# Patient Record
Sex: Male | Born: 1945 | Race: White | Hispanic: No | Marital: Married | Smoking: Former smoker
Health system: Southern US, Community
[De-identification: ages and names within clinical notes are randomized; demographics above are authoritative.]

## PROBLEM LIST (undated history)

## (undated) DIAGNOSIS — I10 Essential (primary) hypertension: Secondary | ICD-10-CM

## (undated) DIAGNOSIS — I219 Acute myocardial infarction, unspecified: Secondary | ICD-10-CM

## (undated) DIAGNOSIS — G8929 Other chronic pain: Secondary | ICD-10-CM

## (undated) DIAGNOSIS — M549 Dorsalgia, unspecified: Secondary | ICD-10-CM

## (undated) DIAGNOSIS — E785 Hyperlipidemia, unspecified: Secondary | ICD-10-CM

## (undated) DIAGNOSIS — D126 Benign neoplasm of colon, unspecified: Secondary | ICD-10-CM

## (undated) DIAGNOSIS — I2510 Atherosclerotic heart disease of native coronary artery without angina pectoris: Secondary | ICD-10-CM

## (undated) DIAGNOSIS — K635 Polyp of colon: Secondary | ICD-10-CM

## (undated) HISTORY — PX: LUMBAR SPINE SURGERY: SHX701

## (undated) HISTORY — DX: Dorsalgia, unspecified: M54.9

## (undated) HISTORY — DX: Essential (primary) hypertension: I10

## (undated) HISTORY — PX: CORONARY ARTERY BYPASS GRAFT: SHX141

## (undated) HISTORY — PX: SHOULDER SURGERY: SHX246

## (undated) HISTORY — PX: BICEPS TENDON REPAIR: SHX566

## (undated) HISTORY — DX: Other chronic pain: G89.29

## (undated) HISTORY — DX: Hyperlipidemia, unspecified: E78.5

---

## 1997-11-21 ENCOUNTER — Ambulatory Visit (HOSPITAL_COMMUNITY): Admission: RE | Admit: 1997-11-21 | Discharge: 1997-11-21 | Payer: Self-pay | Admitting: Neurosurgery

## 1997-12-12 ENCOUNTER — Inpatient Hospital Stay (HOSPITAL_COMMUNITY): Admission: RE | Admit: 1997-12-12 | Discharge: 1997-12-14 | Payer: Self-pay | Admitting: Neurosurgery

## 1998-01-16 ENCOUNTER — Ambulatory Visit (HOSPITAL_COMMUNITY): Admission: RE | Admit: 1998-01-16 | Discharge: 1998-01-16 | Payer: Self-pay | Admitting: Neurosurgery

## 1998-01-16 ENCOUNTER — Encounter: Payer: Self-pay | Admitting: Neurosurgery

## 1998-03-26 ENCOUNTER — Ambulatory Visit (HOSPITAL_COMMUNITY): Admission: RE | Admit: 1998-03-26 | Discharge: 1998-03-26 | Payer: Self-pay | Admitting: Neurosurgery

## 1998-03-26 ENCOUNTER — Encounter: Payer: Self-pay | Admitting: Neurosurgery

## 1998-10-22 ENCOUNTER — Ambulatory Visit (HOSPITAL_COMMUNITY): Admission: RE | Admit: 1998-10-22 | Discharge: 1998-10-22 | Payer: Self-pay | Admitting: Neurosurgery

## 1998-10-22 ENCOUNTER — Encounter: Payer: Self-pay | Admitting: Neurosurgery

## 1998-11-03 ENCOUNTER — Ambulatory Visit (HOSPITAL_COMMUNITY): Admission: RE | Admit: 1998-11-03 | Discharge: 1998-11-03 | Payer: Self-pay | Admitting: Neurosurgery

## 1998-11-03 ENCOUNTER — Encounter: Payer: Self-pay | Admitting: Neurosurgery

## 2002-02-19 ENCOUNTER — Encounter: Payer: Self-pay | Admitting: Orthopedic Surgery

## 2002-02-19 ENCOUNTER — Encounter: Admission: RE | Admit: 2002-02-19 | Discharge: 2002-02-19 | Payer: Self-pay | Admitting: Orthopedic Surgery

## 2002-02-21 ENCOUNTER — Ambulatory Visit (HOSPITAL_BASED_OUTPATIENT_CLINIC_OR_DEPARTMENT_OTHER): Admission: RE | Admit: 2002-02-21 | Discharge: 2002-02-22 | Payer: Self-pay | Admitting: Orthopedic Surgery

## 2007-10-31 ENCOUNTER — Ambulatory Visit: Payer: Self-pay | Admitting: Gastroenterology

## 2007-11-14 ENCOUNTER — Emergency Department: Payer: Self-pay | Admitting: Emergency Medicine

## 2007-11-14 ENCOUNTER — Other Ambulatory Visit: Payer: Self-pay

## 2008-06-05 ENCOUNTER — Emergency Department: Payer: Self-pay | Admitting: Emergency Medicine

## 2010-11-21 ENCOUNTER — Emergency Department: Payer: Self-pay | Admitting: Emergency Medicine

## 2011-02-23 ENCOUNTER — Telehealth: Payer: Self-pay | Admitting: *Deleted

## 2011-02-23 MED ORDER — DIAZEPAM 5 MG PO TABS: 5.0000 mg | ORAL_TABLET | Freq: Two times a day (BID) | ORAL | Status: DC | PRN

## 2011-02-23 NOTE — Telephone Encounter (Signed)
Called in.

## 2011-02-23 NOTE — Telephone Encounter (Signed)
Pharm faxed RF request -  Diazepam 5 mg 1 bid # 60. OK?

## 2011-02-23 NOTE — Telephone Encounter (Signed)
Ok #[phone] 

## 2011-06-23 ENCOUNTER — Ambulatory Visit (INDEPENDENT_AMBULATORY_CARE_PROVIDER_SITE_OTHER): Payer: Medicare Other | Admitting: Internal Medicine

## 2011-06-23 ENCOUNTER — Encounter: Payer: Self-pay | Admitting: Internal Medicine

## 2011-06-23 ENCOUNTER — Other Ambulatory Visit: Payer: Self-pay | Admitting: Internal Medicine

## 2011-06-23 VITALS — BP 128/62 | HR 70 | Temp 97.7°F | Ht 71.0 in | Wt 194.0 lb

## 2011-06-23 DIAGNOSIS — R1084 Generalized abdominal pain: Secondary | ICD-10-CM

## 2011-06-23 DIAGNOSIS — E785 Hyperlipidemia, unspecified: Secondary | ICD-10-CM | POA: Insufficient documentation

## 2011-06-23 DIAGNOSIS — M539 Dorsopathy, unspecified: Secondary | ICD-10-CM

## 2011-06-23 DIAGNOSIS — G4700 Insomnia, unspecified: Secondary | ICD-10-CM

## 2011-06-23 DIAGNOSIS — M62830 Muscle spasm of back: Secondary | ICD-10-CM

## 2011-06-23 LAB — COMPREHENSIVE METABOLIC PANEL
AST: 23 U/L (ref 0–37)
BUN: 16 mg/dL (ref 6–23)
CO2: 29 mEq/L (ref 19–32)
Calcium: 9.3 mg/dL (ref 8.4–10.5)
Creatinine, Ser: 0.9 mg/dL (ref 0.4–1.5)
Glucose, Bld: 109 mg/dL — ABNORMAL HIGH (ref 70–99)
Potassium: 4.2 mEq/L (ref 3.5–5.1)
Sodium: 139 mEq/L (ref 135–145)
Total Bilirubin: 0.2 mg/dL — ABNORMAL LOW (ref 0.3–1.2)

## 2011-06-23 LAB — CBC WITH DIFFERENTIAL/PLATELET
Basophils Relative: 1 % (ref 0.0–3.0)
Eosinophils Absolute: 0.3 10*3/uL (ref 0.0–0.7)
HCT: 45 % (ref 39.0–52.0)
Hemoglobin: 14.9 g/dL (ref 13.0–17.0)
Lymphocytes Relative: 28.6 % (ref 12.0–46.0)
Lymphs Abs: 2.5 10*3/uL (ref 0.7–4.0)
MCHC: 33.2 g/dL (ref 30.0–36.0)
Monocytes Relative: 8.7 % (ref 3.0–12.0)
Neutro Abs: 5 10*3/uL (ref 1.4–7.7)
RBC: 4.92 Mil/uL (ref 4.22–5.81)

## 2011-06-23 LAB — LIPID PANEL
HDL: 38.1 mg/dL — ABNORMAL LOW (ref >39.00)
Total CHOL/HDL Ratio: 3
VLDL: 17.2 mg/dL (ref 0.0–40.0)

## 2011-06-23 LAB — TSH: TSH: 0.58 u[IU]/mL (ref 0.35–5.50)

## 2011-06-23 MED ORDER — AMITRIPTYLINE HCL 25 MG PO TABS: 25.0000 mg | ORAL_TABLET | Freq: Every day | ORAL | Status: DC

## 2011-06-23 MED ORDER — DIAZEPAM 5 MG PO TABS: 5.0000 mg | ORAL_TABLET | Freq: Two times a day (BID) | ORAL | Status: AC | PRN

## 2011-06-23 NOTE — Assessment & Plan Note (Signed)
Will try adding amitriptyline at bedtime to see if any improvement. Patient will e-mail with followup. He will also followup in clinic in one month.

## 2011-06-23 NOTE — Progress Notes (Signed)
Subjective:    Patient ID: Harry Pacheco, male    DOB: 03-Apr-1946, 66 y.o.   MRN: [id number]  HPI 66 year old male with history of chronic back pain, chronic insomnia, and hyperlipidemia presents for followup. He has 2 concerns today. First, he reports increased abdominal distention and diffuse abdominal pain over the last several weeks. He has also noticed increased bloating and belching. He notes frequent episodes of watery diarrhea. He has not noticed any blood in his stool. He has not had any nausea or vomiting. He notes that he feels similar to how he felt prior to his diagnosis of H. pylori last year. He notes that he completed treatment for this but now symptoms have recurred.  Second concern today is chronic insomnia. He notes that he has tried several medications in the past with little improvement. He has tried Ambien with no success. He also tried trazodone, which improved his ability to fall asleep but left him groggy in the morning. Currently, he has difficulty both falling asleep and staying asleep. He often goes up to 4 nights without sleeping at all. He is an extremely fatigued during the day. He has limited his caffeine intake and has 2 cups of coffee in the morning and one glass of sweet tea with dinner. He has tried to eliminate distractions in the bedroom. When he does wake, he leaves the bedroom and reads. He notes that he occasionally does not fall asleep until 5 or 6 in the morning. He denies any pain which is limiting his ability to sleep. He does occasionally use Valium for back pain and on days when he takes Valium he does not have trouble sleeping. He does not want to take this medication on a regular basis because he is concerned about potential addiction. His wife notes that he sometimes has increased anxiety and she feels that this might be contributing to his insomnia.  Outpatient Encounter Prescriptions as of 06/23/2011  Medication Sig Dispense Refill  . aspirin 325 MG  tablet Take 325 mg by mouth daily.      Marland Kitchen atenolol (TENORMIN) 100 MG tablet Take 100 mg by mouth daily.      . diazepam (VALIUM) 5 MG tablet Take 1 tablet (5 mg total) by mouth 2 (two) times daily as needed.  90 tablet  3  . losartan (COZAAR) 50 MG tablet Take 50 mg by mouth daily.      . simvastatin (ZOCOR) 20 MG tablet Take 20 mg by mouth every evening.      Marland Kitchen DISCONTD: diazepam (VALIUM) 5 MG tablet Take 1 tablet (5 mg total) by mouth 2 (two) times daily as needed.  60 tablet  0  . amitriptyline (ELAVIL) 25 MG tablet Take 1 tablet (25 mg total) by mouth at bedtime.  30 tablet  3    Review of Systems  Constitutional: Negative for fever, chills, activity change, appetite change, fatigue and unexpected weight change.  Eyes: Negative for visual disturbance.  Respiratory: Negative for cough and shortness of breath.   Cardiovascular: Negative for chest pain, palpitations and leg swelling.  Gastrointestinal: Positive for abdominal pain, diarrhea and abdominal distention. Negative for nausea and vomiting.  Genitourinary: Negative for dysuria, urgency and difficulty urinating.  Musculoskeletal: Positive for back pain. Negative for arthralgias and gait problem.  Skin: Negative for color change and rash.  Hematological: Negative for adenopathy.  Psychiatric/Behavioral: Positive for sleep disturbance. Negative for dysphoric mood. The patient is not nervous/anxious.    BP 128/62  Pulse 70  Temp(Src) 97.7 F (36.5 C) (Oral)  Ht 5\' 11"  (1.803 m)  Wt 194 lb (87.998 kg)  BMI 27.06 kg/m2  SpO2 97%     Objective:   Physical Exam  Constitutional: He is oriented to person, place, and time. He appears well-developed and well-nourished. No distress.  HENT:  Head: Normocephalic and atraumatic.  Right Ear: External ear normal.  Left Ear: External ear normal.  Nose: Nose normal.  Mouth/Throat: Oropharynx is clear and moist. No oropharyngeal exudate.  Eyes: Conjunctivae and EOM are normal. Pupils are  equal, round, and reactive to light. Right eye exhibits no discharge. Left eye exhibits no discharge. No scleral icterus.  Neck: Normal range of motion. Neck supple. No tracheal deviation present. No thyromegaly present.  Cardiovascular: Normal rate, regular rhythm and normal heart sounds.  Exam reveals no gallop and no friction rub.   No murmur heard. Pulmonary/Chest: Effort normal and breath sounds normal. No respiratory distress. He has no wheezes. He has no rales. He exhibits no tenderness.  Abdominal: Soft. Bowel sounds are normal. He exhibits no distension and no mass. There is no tenderness. There is no rebound and no guarding.  Musculoskeletal: Normal range of motion. He exhibits no edema.  Lymphadenopathy:    He has no cervical adenopathy.  Neurological: He is alert and oriented to person, place, and time. No cranial nerve deficit. Coordination normal.  Skin: Skin is warm and dry. No rash noted. He is not diaphoretic. No erythema. No pallor.  Psychiatric: He has a normal mood and affect. His behavior is normal. Judgment and thought content normal.          Assessment & Plan:

## 2011-06-23 NOTE — Assessment & Plan Note (Signed)
Will check lipids and LFTs with labs today. 

## 2011-06-23 NOTE — Assessment & Plan Note (Signed)
Symptoms currently well-controlled using Valium 2-3 times per month for severe pain. Refill given today. Followup in one month.

## 2011-06-23 NOTE — Assessment & Plan Note (Signed)
Suspect this is recurrence of H. pylori. Will send stool testing today. We'll also check CBC, CMP. If testing for H. pylori is negative, will set up with GI for colonoscopy. Follow up 1 month.

## 2011-06-24 ENCOUNTER — Encounter: Payer: Self-pay | Admitting: Internal Medicine

## 2011-06-24 LAB — HELICOBACTER PYLORI  SPECIAL ANTIGEN: H. PYLORI Antigen: NEGATIVE

## 2011-06-27 LAB — STOOL CULTURE

## 2011-07-05 ENCOUNTER — Encounter: Payer: Self-pay | Admitting: Internal Medicine

## 2011-07-05 DIAGNOSIS — G4700 Insomnia, unspecified: Secondary | ICD-10-CM

## 2011-07-05 MED ORDER — AMITRIPTYLINE HCL 50 MG PO TABS: 50.0000 mg | ORAL_TABLET | Freq: Every day | ORAL | Status: DC

## 2011-07-26 ENCOUNTER — Ambulatory Visit: Payer: Medicare Other | Admitting: Internal Medicine

## 2011-09-16 ENCOUNTER — Ambulatory Visit (INDEPENDENT_AMBULATORY_CARE_PROVIDER_SITE_OTHER): Payer: Medicare Other | Admitting: Internal Medicine

## 2011-09-16 ENCOUNTER — Encounter: Payer: Self-pay | Admitting: Internal Medicine

## 2011-09-16 VITALS — BP 102/60 | HR 51 | Temp 98.7°F | Ht 71.0 in | Wt 197.5 lb

## 2011-09-16 DIAGNOSIS — G4700 Insomnia, unspecified: Secondary | ICD-10-CM

## 2011-09-16 DIAGNOSIS — M109 Gout, unspecified: Secondary | ICD-10-CM

## 2011-09-16 LAB — COMPREHENSIVE METABOLIC PANEL
ALT: 21 U/L (ref 0–53)
AST: 24 U/L (ref 0–37)
Alkaline Phosphatase: 61 U/L (ref 39–117)
Creatinine, Ser: 1.1 mg/dL (ref 0.4–1.5)
GFR: 72.42 mL/min (ref >60.00)
Total Bilirubin: 0.4 mg/dL (ref 0.3–1.2)

## 2011-09-16 NOTE — Assessment & Plan Note (Signed)
Symptoms are most consistent with gouty flare. We discussed potential triggers for gout including dietary triggers and medications. We also discussed treatments for acute gout flare and for maintenance. Will check uric acid level with labs today. Given that symptoms are improving, we'll hold off on any further intervention at this time. Patient will call if symptoms are recurrent. Noted he has had severe anxiety with prednisone in the past so would prefer not to use this in the future.

## 2011-09-16 NOTE — Patient Instructions (Signed)
Gout Gout is an inflammatory condition (arthritis) caused by a buildup of uric acid crystals in the joints. Uric acid is a chemical that is normally present in the blood. Under some circumstances, uric acid can form into crystals in your joints. This causes joint redness, soreness, and swelling (inflammation). Repeat attacks are common. Over time, uric acid crystals can form into masses (tophi) near a joint, causing disfigurement. Gout is treatable and often preventable. CAUSES  The disease begins with elevated levels of uric acid in the blood. Uric acid is produced by your body when it breaks down a naturally found substance called purines. This also happens when you eat certain foods such as meats and fish. Causes of an elevated uric acid level include:  Being passed down from parent to child (heredity).   Diseases that cause increased uric acid production (obesity, psoriasis, some cancers).   Excessive alcohol use.   Diet, especially diets rich in meat and seafood.   Medicines, including certain cancer-fighting drugs (chemotherapy), diuretics, and aspirin.   Chronic kidney disease. The kidneys are no longer able to remove uric acid well.   Problems with metabolism.  Conditions strongly associated with gout include:  Obesity.   High blood pressure.   High cholesterol.   Diabetes.  Not everyone with elevated uric acid levels gets gout. It is not understood why some people get gout and others do not. Surgery, joint injury, and eating too much of certain foods are some of the factors that can lead to gout. SYMPTOMS   An attack of gout comes on quickly. It causes intense pain with redness, swelling, and warmth in a joint.   Fever can occur.   Often, only one joint is involved. Certain joints are more commonly involved:   Base of the big toe.   Knee.   Ankle.   Wrist.   Finger.  Without treatment, an attack usually goes away in a few days to weeks. Between attacks, you  usually will not have symptoms, which is different from many other forms of arthritis. DIAGNOSIS  Your caregiver will suspect gout based on your symptoms and exam. Removal of fluid from the joint (arthrocentesis) is done to check for uric acid crystals. Your caregiver will give you a medicine that numbs the area (local anesthetic) and use a needle to remove joint fluid for exam. Gout is confirmed when uric acid crystals are seen in joint fluid, using a special microscope. Sometimes, blood, urine, and X-ray tests are also used. TREATMENT  There are 2 phases to gout treatment: treating the sudden onset (acute) attack and preventing attacks (prophylaxis). Treatment of an Acute Attack  Medicines are used. These include anti-inflammatory medicines or steroid medicines.   An injection of steroid medicine into the affected joint is sometimes necessary.   The painful joint is rested. Movement can worsen the arthritis.   You may use warm or cold treatments on painful joints, depending which works best for you.   Discuss the use of coffee, vitamin C, or cherries with your caregiver. These may be helpful treatment options.  Treatment to Prevent Attacks After the acute attack subsides, your caregiver may advise prophylactic medicine. These medicines either help your kidneys eliminate uric acid from your body or decrease your uric acid production. You may need to stay on these medicines for a very long time. The early phase of treatment with prophylactic medicine can be associated with an increase in acute gout attacks. For this reason, during the first few months   of treatment, your caregiver may also advise you to take medicines usually used for acute gout treatment. Be sure you understand your caregiver's directions. You should also discuss dietary treatment with your caregiver. Certain foods such as meats and fish can increase uric acid levels. Other foods such as dairy can decrease levels. Your caregiver  can give you a list of foods to avoid. HOME CARE INSTRUCTIONS   Do not take aspirin to relieve pain. This raises uric acid levels.   Only take over-the-counter or prescription medicines for pain, discomfort, or fever as directed by your caregiver.   Rest the joint as much as possible. When in bed, keep sheets and blankets off painful areas.   Keep the affected joint raised (elevated).   Use crutches if the painful joint is in your leg.   Drink enough water and fluids to keep your urine clear or pale yellow. This helps your body get rid of uric acid. Do not drink alcoholic beverages. They slow the passage of uric acid.   Follow your caregiver's dietary instructions. Pay careful attention to the amount of protein you eat. Your daily diet should emphasize fruits, vegetables, whole grains, and fat-free or low-fat milk products.   Maintain a healthy body weight.  SEEK MEDICAL CARE IF:   You have an oral temperature above 102 F (38.9 C).   You develop diarrhea, vomiting, or any side effects from medicines.   You do not feel better in 24 hours, or you are getting worse.  SEEK IMMEDIATE MEDICAL CARE IF:   Your joint becomes suddenly more tender and you have:   Chills.   An oral temperature above 102 F (38.9 C), not controlled by medicine.  MAKE SURE YOU:   Understand these instructions.   Will watch your condition.   Will get help right away if you are not doing well or get worse.  Document Released: 04/02/2000 Document Revised: 03/25/2011 Document Reviewed: 07/14/2009 [employer] Patient Information 2012 [employer]. 

## 2011-09-16 NOTE — Progress Notes (Signed)
Subjective:    Patient ID: Harry Pacheco, male    DOB: February 18, 1946, 66 y.o.   MRN: [id number]  HPI 66 year old male with history of chronic back pain, insomnia, and hypertension presents for acute visit complaining of right foot pain. He reports that approximately one week ago he developed severe right foot pain over his first MTP joint. He reports that the overlying skin was red and swollen. The area was painful to touch. He denies any trauma to the area. He read some information about gout and tried to adopt a diet that would help treat this. Over the subsequent week, the pain and redness have improved. He did not take nonsteroidals. He does not have any known history of gout. He does consume wine on a nightly basis. He occasionally has red meat.  In regards to his history of insomnia, he reports significant improvement with use of amitriptyline. He typically takes his medication about one hour before bedtime and is able to have a folate sleep. He denies any noted side effects from the medicine.  Outpatient Encounter Prescriptions as of 09/16/2011  Medication Sig Dispense Refill  . amitriptyline (ELAVIL) 50 MG tablet Take 1 tablet (50 mg total) by mouth at bedtime.  30 tablet  3  . aspirin 325 MG tablet Take 325 mg by mouth daily.      Marland Kitchen atenolol (TENORMIN) 100 MG tablet Take 100 mg by mouth daily.      . diazepam (VALIUM) 5 MG tablet Take 1 tablet (5 mg total) by mouth 2 (two) times daily as needed.  90 tablet  3  . losartan (COZAAR) 50 MG tablet Take 50 mg by mouth daily.      . simvastatin (ZOCOR) 20 MG tablet Take 20 mg by mouth every evening.        Review of Systems  Constitutional: Negative for fever, chills, activity change, appetite change, fatigue and unexpected weight change.  Eyes: Negative for visual disturbance.  Respiratory: Negative for cough and shortness of breath.   Cardiovascular: Negative for chest pain, palpitations and leg swelling.  Gastrointestinal: Negative for  abdominal pain and abdominal distention.  Genitourinary: Negative for dysuria, urgency and difficulty urinating.  Musculoskeletal: Positive for myalgias and arthralgias. Negative for gait problem.  Skin: Positive for color change. Negative for rash.  Hematological: Negative for adenopathy.  Psychiatric/Behavioral: Positive for sleep disturbance. Negative for dysphoric mood. The patient is not nervous/anxious.    BP 102/60  Pulse 51  Temp(Src) 98.7 F (37.1 C) (Oral)  Ht 5\' 11"  (1.803 m)  Wt 197 lb 8 oz (89.585 kg)  BMI 27.55 kg/m2     Objective:   Physical Exam  Constitutional: He is oriented to person, place, and time. He appears well-developed and well-nourished. No distress.  HENT:  Head: Normocephalic and atraumatic.  Right Ear: External ear normal.  Left Ear: External ear normal.  Nose: Nose normal.  Mouth/Throat: Oropharynx is clear and moist. No oropharyngeal exudate.  Eyes: Conjunctivae and EOM are normal. Pupils are equal, round, and reactive to light. Right eye exhibits no discharge. Left eye exhibits no discharge. No scleral icterus.  Neck: Normal range of motion. Neck supple. No tracheal deviation present. No thyromegaly present.  Pulmonary/Chest: Effort normal.  Musculoskeletal: Normal range of motion. He exhibits no edema.       Right foot: He exhibits tenderness.       Feet:  Lymphadenopathy:    He has no cervical adenopathy.  Neurological: He is alert and oriented to person,  place, and time. No cranial nerve deficit. Coordination normal.  Skin: Skin is warm and dry. No rash noted. He is not diaphoretic. There is erythema. No pallor.  Psychiatric: He has a normal mood and affect. His behavior is normal. Judgment and thought content normal.          Assessment & Plan:

## 2011-09-16 NOTE — Assessment & Plan Note (Signed)
Improved with use of amitriptyline. Will continue.

## 2011-11-04 ENCOUNTER — Other Ambulatory Visit: Payer: Self-pay | Admitting: *Deleted

## 2011-11-04 DIAGNOSIS — G4700 Insomnia, unspecified: Secondary | ICD-10-CM

## 2011-11-04 MED ORDER — AMITRIPTYLINE HCL 50 MG PO TABS: 50.0000 mg | ORAL_TABLET | Freq: Every day | ORAL | Status: DC

## 2011-11-11 ENCOUNTER — Encounter: Payer: Self-pay | Admitting: *Deleted

## 2011-11-23 ENCOUNTER — Encounter: Payer: Self-pay | Admitting: Internal Medicine

## 2012-02-23 ENCOUNTER — Other Ambulatory Visit: Payer: Self-pay | Admitting: Internal Medicine

## 2012-05-29 ENCOUNTER — Other Ambulatory Visit: Payer: Self-pay | Admitting: Internal Medicine

## 2012-06-03 ENCOUNTER — Other Ambulatory Visit: Payer: Self-pay

## 2012-08-29 ENCOUNTER — Other Ambulatory Visit: Payer: Self-pay | Admitting: Internal Medicine

## 2012-09-01 ENCOUNTER — Ambulatory Visit (INDEPENDENT_AMBULATORY_CARE_PROVIDER_SITE_OTHER): Payer: Medicare Other | Admitting: Internal Medicine

## 2012-09-01 ENCOUNTER — Encounter: Payer: Self-pay | Admitting: Internal Medicine

## 2012-09-01 VITALS — BP 150/94 | HR 85 | Temp 98.3°F | Wt 199.0 lb

## 2012-09-01 DIAGNOSIS — I10 Essential (primary) hypertension: Secondary | ICD-10-CM | POA: Insufficient documentation

## 2012-09-01 DIAGNOSIS — M109 Gout, unspecified: Secondary | ICD-10-CM

## 2012-09-01 LAB — COMPREHENSIVE METABOLIC PANEL
AST: 22 U/L (ref 0–37)
Albumin: 4.2 g/dL (ref 3.5–5.2)
BUN: 16 mg/dL (ref 6–23)
CO2: 29 mEq/L (ref 19–32)
Calcium: 9.3 mg/dL (ref 8.4–10.5)
Chloride: 103 mEq/L (ref 96–112)
Potassium: 5 mEq/L (ref 3.5–5.1)

## 2012-09-01 LAB — LIPID PANEL
Total CHOL/HDL Ratio: 4
Triglycerides: 92 mg/dL (ref 0.0–149.0)

## 2012-09-01 MED ORDER — INDOMETHACIN 50 MG PO CAPS: 50.0000 mg | ORAL_CAPSULE | Freq: Three times a day (TID) | ORAL | Status: AC

## 2012-09-01 MED ORDER — COLCHICINE 0.6 MG PO TABS: ORAL_TABLET | ORAL | Status: AC

## 2012-09-01 NOTE — Assessment & Plan Note (Signed)
BP Readings from Last 3 Encounters:  09/01/12 150/94  09/16/11 102/60  06/23/11 128/62   BP slightly high today, likely due to significant pain from gout flare. Will continue current medications. Will check renal function with labs.

## 2012-09-01 NOTE — Progress Notes (Signed)
Subjective:    Patient ID: Harry Pacheco, male    DOB: 1945/06/12, 67 y.o.   MRN: [id number]  HPI 66 year old male presents for followup. His primary concern today is recent gout flare in his right foot. He describes pain, swelling, and redness over his right great toe. This first developed about one week ago. He has been taking Tylenol with no improvement. Symptoms are consistent with previous episodes of gout.  Aside from this, he is feeling well. He is compliant with medications. He continues to use amitriptyline at night to help with sleep. He also occasionally takes Valium as needed for spasm in his lower back. He reports good improvement with this.  Outpatient Prescriptions Prior to Visit  Medication Sig Dispense Refill  . amitriptyline (ELAVIL) 50 MG tablet TAKE 1 TABLET BY MOUTH AT BEDTIME  30 tablet  3  . aspirin 325 MG tablet Take 325 mg by mouth daily.      Marland Kitchen atenolol (TENORMIN) 100 MG tablet Take 100 mg by mouth daily.      . diazepam (VALIUM) 5 MG tablet TAKE 1 TABLET TWICE A DAY AS NEEDED  90 tablet  1  . losartan (COZAAR) 50 MG tablet Take 50 mg by mouth daily.      . simvastatin (ZOCOR) 20 MG tablet Take 20 mg by mouth every evening.       No facility-administered medications prior to visit.    Review of Systems  Constitutional: Negative for fever, chills, activity change, appetite change, fatigue and unexpected weight change.  Eyes: Negative for visual disturbance.  Respiratory: Negative for cough and shortness of breath.   Cardiovascular: Negative for chest pain, palpitations and leg swelling.  Gastrointestinal: Negative for abdominal pain and abdominal distention.  Genitourinary: Negative for dysuria, urgency and difficulty urinating.  Musculoskeletal: Positive for myalgias, joint swelling and arthralgias. Negative for gait problem.  Skin: Positive for color change. Negative for rash.  Hematological: Negative for adenopathy.  Psychiatric/Behavioral: Negative for  sleep disturbance and dysphoric mood. The patient is not nervous/anxious.        Objective:   Physical Exam  Constitutional: He is oriented to person, place, and time. He appears well-developed and well-nourished. No distress.  HENT:  Head: Normocephalic and atraumatic.  Right Ear: External ear normal.  Left Ear: External ear normal.  Nose: Nose normal.  Mouth/Throat: Oropharynx is clear and moist. No oropharyngeal exudate.  Eyes: Conjunctivae and EOM are normal. Pupils are equal, round, and reactive to light. Right eye exhibits no discharge. Left eye exhibits no discharge. No scleral icterus.  Neck: Normal range of motion. Neck supple. No tracheal deviation present. No thyromegaly present.  Cardiovascular: Normal rate, regular rhythm and normal heart sounds.  Exam reveals no gallop and no friction rub.   No murmur heard. Pulmonary/Chest: Effort normal and breath sounds normal. No respiratory distress. He has no wheezes. He has no rales. He exhibits no tenderness.  Musculoskeletal: He exhibits no edema.       Right foot: He exhibits decreased range of motion (great toe), tenderness, bony tenderness and swelling.  Lymphadenopathy:    He has no cervical adenopathy.  Neurological: He is alert and oriented to person, place, and time. No cranial nerve deficit. Coordination normal.  Skin: Skin is warm and dry. No rash noted. He is not diaphoretic. No erythema. No pallor.  Psychiatric: He has a normal mood and affect. His behavior is normal. Judgment and thought content normal.  Assessment & Plan:

## 2012-09-01 NOTE — Assessment & Plan Note (Signed)
Symptoms of pain, induration right great toe, consistent with gout flare. Will start colchicine and indomethacin. Pt will call if symptoms are not improving.

## 2012-10-23 ENCOUNTER — Other Ambulatory Visit: Payer: Self-pay | Admitting: Internal Medicine

## 2013-01-13 ENCOUNTER — Other Ambulatory Visit: Payer: Self-pay | Admitting: Internal Medicine

## 2013-02-22 ENCOUNTER — Other Ambulatory Visit: Payer: Self-pay

## 2013-03-07 ENCOUNTER — Encounter: Payer: Medicare Other | Admitting: Internal Medicine

## 2013-03-13 ENCOUNTER — Encounter: Payer: Medicare Other | Admitting: Internal Medicine

## 2013-03-22 ENCOUNTER — Encounter: Payer: Self-pay | Admitting: *Deleted

## 2013-03-23 ENCOUNTER — Ambulatory Visit (INDEPENDENT_AMBULATORY_CARE_PROVIDER_SITE_OTHER): Payer: Medicare Other | Admitting: Internal Medicine

## 2013-03-23 ENCOUNTER — Encounter: Payer: Self-pay | Admitting: Internal Medicine

## 2013-03-23 VITALS — BP 140/80 | HR 79 | Temp 98.0°F | Ht 71.0 in | Wt 200.0 lb

## 2013-03-23 DIAGNOSIS — G4700 Insomnia, unspecified: Secondary | ICD-10-CM

## 2013-03-23 DIAGNOSIS — J019 Acute sinusitis, unspecified: Secondary | ICD-10-CM | POA: Insufficient documentation

## 2013-03-23 DIAGNOSIS — Z0000 Encounter for general adult medical examination without abnormal findings: Secondary | ICD-10-CM

## 2013-03-23 DIAGNOSIS — I10 Essential (primary) hypertension: Secondary | ICD-10-CM

## 2013-03-23 DIAGNOSIS — Z125 Encounter for screening for malignant neoplasm of prostate: Secondary | ICD-10-CM

## 2013-03-23 DIAGNOSIS — Z23 Encounter for immunization: Secondary | ICD-10-CM

## 2013-03-23 LAB — LIPID PANEL
HDL: 28.2 mg/dL — ABNORMAL LOW (ref >39.00)
LDL Cholesterol: 55 mg/dL (ref 0–99)
Total CHOL/HDL Ratio: 4
Triglycerides: 173 mg/dL — ABNORMAL HIGH (ref 0.0–149.0)
VLDL: 34.6 mg/dL (ref 0.0–40.0)

## 2013-03-23 LAB — CBC WITH DIFFERENTIAL/PLATELET
Basophils Relative: 0.2 % (ref 0.0–3.0)
Eosinophils Relative: 1.5 % (ref 0.0–5.0)
HCT: 45.2 % (ref 39.0–52.0)
MCV: 89.5 fl (ref 78.0–100.0)
Monocytes Absolute: 0.8 10*3/uL (ref 0.1–1.0)
Monocytes Relative: 6.5 % (ref 3.0–12.0)
Neutrophils Relative %: 75.7 % (ref 43.0–77.0)
RBC: 5.05 Mil/uL (ref 4.22–5.81)
WBC: 12.7 10*3/uL — ABNORMAL HIGH (ref 4.5–10.5)

## 2013-03-23 LAB — COMPREHENSIVE METABOLIC PANEL
Albumin: 4.2 g/dL (ref 3.5–5.2)
Alkaline Phosphatase: 62 U/L (ref 39–117)
BUN: 14 mg/dL (ref 6–23)
GFR: 80.54 mL/min (ref >60.00)
Glucose, Bld: 120 mg/dL — ABNORMAL HIGH (ref 70–99)
Total Bilirubin: 0.4 mg/dL (ref 0.3–1.2)

## 2013-03-23 LAB — MICROALBUMIN / CREATININE URINE RATIO
Creatinine,U: 48.3 mg/dL
Microalb, Ur: 0.7 mg/dL (ref 0.0–1.9)

## 2013-03-23 MED ORDER — AZITHROMYCIN 250 MG PO TABS: ORAL_TABLET | ORAL | Status: DC

## 2013-03-23 MED ORDER — AMITRIPTYLINE HCL 50 MG PO TABS: ORAL_TABLET | ORAL | Status: DC

## 2013-03-23 NOTE — Progress Notes (Signed)
Subjective:    Patient ID: Harry Pacheco, male    DOB: [birth year], 67 y.o.   MRN: [id number]  HPI The patient is here for annual Medicare wellness examination and management of other chronic and acute problems.   The risk factors are reflected in the social history.  The roster of all physicians providing medical care to patient - is listed in the Snapshot section of the chart.  Activities of daily living:  The patient is 100% independent in all ADLs: dressing, toileting, feeding as well as independent mobility. Lives with wife in [place]. Cat in house.  Home safety : The patient has smoke detectors in the home. They wear seatbelts.  There are no firearms at home. There is no violence in the home.   There is no risks for hepatitis, STDs or HIV. There is no history of blood transfusion. They have no travel history to infectious disease endemic areas of the world.  The patient has seen their dentist in the last six month. Dentist - Dr. Jarold Motto They have seen their eye doctor in the last year. Opthalmology - Dr. Margarita Grizzle  No issues with hearing.They have deferred audiologic testing in the last year.   They do not  have excessive sun exposure. Discussed the need for sun protection: hats, long sleeves and use of sunscreen if there is significant sun exposure. Dermatologist - Dr. Jarold Motto Cardiologist - Dr. Cassie Freer  Diet: the importance of a healthy diet is discussed. They do have a relatively healthy diet.  The benefits of regular aerobic exercise were discussed. He exercises by walking and fishing. Very active.  Depression screen: there are no signs or vegative symptoms of depression- irritability, change in appetite, anhedonia, sadness/tearfullness.  Cognitive assessment: the patient manages all their financial and personal affairs and is actively engaged. They could relate day,date,year and events. Manages own finances.  HCPOA - wife, Miciah Covelli  The following  portions of the patient's history were reviewed and updated as appropriate: allergies, current medications, past family history, past medical history,  past surgical history, past social history  and problem list.  Visual acuity was not assessed per patient preference since he has regular follow up with ophthalmologist. Hearing and body mass index were assessed and reviewed.   During the course of the visit the patient was educated and counseled about appropriate screening and preventive services including : fall prevention , diabetes screening, nutrition counseling, colorectal cancer screening, and recommended immunizations.    Concerned today about congestion which started today. Raking leaves earlier this week. No fever, chills. Itchy throat. No chest pain or cough. Not taking anything for symptoms  Review of Systems  Constitutional: Negative for fever, chills, activity change, appetite change, fatigue and unexpected weight change.  Eyes: Negative for visual disturbance.  Respiratory: Negative for cough and shortness of breath.   Cardiovascular: Negative for chest pain, palpitations and leg swelling.  Gastrointestinal: Negative for abdominal pain and abdominal distention.  Genitourinary: Negative for dysuria, urgency and difficulty urinating.  Musculoskeletal: Negative for arthralgias and gait problem.  Skin: Negative for color change and rash.  Hematological: Negative for adenopathy.  Psychiatric/Behavioral: Negative for sleep disturbance and dysphoric mood. The patient is not nervous/anxious.    BP 140/80  Pulse 79  Temp(Src) 98 F (36.7 C) (Oral)  Ht 5\' 11"  (1.803 m)  Wt 200 lb (90.719 kg)  BMI 27.91 kg/m2  SpO2 98%     Objective:   Physical Exam  Constitutional: He is oriented to person,  place, and time. He appears well-developed and well-nourished. No distress.  HENT:  Head: Normocephalic and atraumatic.  Right Ear: External ear normal.  Left Ear: External ear normal.    Nose: Nose normal.  Mouth/Throat: Oropharynx is clear and moist. No oropharyngeal exudate.  Eyes: Conjunctivae and EOM are normal. Pupils are equal, round, and reactive to light. Right eye exhibits no discharge. Left eye exhibits no discharge. No scleral icterus.  Neck: Normal range of motion. Neck supple. No tracheal deviation present. No thyromegaly present.  Cardiovascular: Normal rate, regular rhythm and normal heart sounds.  Exam reveals no gallop and no friction rub.   No murmur heard. Pulmonary/Chest: Effort normal and breath sounds normal. No respiratory distress. He has no wheezes. He has no rales. He exhibits no tenderness.  Abdominal: Soft. Bowel sounds are normal. He exhibits no distension and no mass. There is no tenderness. There is no rebound and no guarding.  Musculoskeletal: Normal range of motion. He exhibits no edema.  Lymphadenopathy:    He has no cervical adenopathy.  Neurological: He is alert and oriented to person, place, and time. No cranial nerve deficit. Coordination normal.  Skin: Skin is warm and dry. No rash noted. He is not diaphoretic. No erythema. No pallor.  Psychiatric: He has a normal mood and affect. His behavior is normal. Judgment and thought content normal.          Assessment & Plan:

## 2013-03-23 NOTE — Assessment & Plan Note (Signed)
General medical exam normal today. Appropriate screening performed. Colonoscopy pending. Immunizations are UTD. Discussed referral to dermatology, however he would like to hold off for now. Will check labs including CBC, CMP, lipids, TSH, Vit D. Encouraged continued healthy diet and regular exercise.

## 2013-03-23 NOTE — Progress Notes (Signed)
Pre-visit discussion using our clinic review tool. No additional management support is needed unless otherwise documented below in the visit note.  

## 2013-03-23 NOTE — Assessment & Plan Note (Signed)
Symptoms consistent with early sinus infection, triggered by allergen exposure. Will have him monitor symptoms for now. If persistent pain, fever, purulent sinus drainage over the weekend, he will start Azithromycin. Follow up prn.

## 2013-03-23 NOTE — Assessment & Plan Note (Signed)
Symptoms well controlled on Amitriptyline. Will continue.

## 2013-03-26 ENCOUNTER — Telehealth: Payer: Self-pay | Admitting: *Deleted

## 2013-03-26 NOTE — Telephone Encounter (Signed)
Patient informed and verbalized understanding

## 2013-03-26 NOTE — Telephone Encounter (Signed)
OK. Local reaction is common, but if swelling is severe or persistent, we should look at his arm.

## 2013-03-26 NOTE — Telephone Encounter (Signed)
Called to inform patient about lab results, he informed me that his left arm where he received the Pneumonia vaccine swelled up over the weekend. Per patient his left arm was swollen about the size of 2 golf balls on Saturday, all the way from the top of his shoulder to his elbow. It did go down yesterday but he was running a low grade fever and really did not feel good. He is a little better today but still not feeling too good. He began taking the Zpack yesterday.

## 2013-05-25 DIAGNOSIS — Z86010 Personal history of colonic polyps: Secondary | ICD-10-CM | POA: Diagnosis not present

## 2013-05-29 DIAGNOSIS — E782 Mixed hyperlipidemia: Secondary | ICD-10-CM | POA: Diagnosis not present

## 2013-05-29 DIAGNOSIS — I10 Essential (primary) hypertension: Secondary | ICD-10-CM | POA: Diagnosis not present

## 2013-05-29 DIAGNOSIS — I2510 Atherosclerotic heart disease of native coronary artery without angina pectoris: Secondary | ICD-10-CM | POA: Diagnosis not present

## 2013-06-04 ENCOUNTER — Ambulatory Visit: Payer: Self-pay | Admitting: Gastroenterology

## 2013-06-04 DIAGNOSIS — I10 Essential (primary) hypertension: Secondary | ICD-10-CM | POA: Diagnosis not present

## 2013-06-04 DIAGNOSIS — K621 Rectal polyp: Secondary | ICD-10-CM | POA: Diagnosis not present

## 2013-06-04 DIAGNOSIS — I2510 Atherosclerotic heart disease of native coronary artery without angina pectoris: Secondary | ICD-10-CM | POA: Diagnosis not present

## 2013-06-04 DIAGNOSIS — Z951 Presence of aortocoronary bypass graft: Secondary | ICD-10-CM | POA: Diagnosis not present

## 2013-06-04 DIAGNOSIS — Z860100 Personal history of colon polyps, unspecified: Secondary | ICD-10-CM | POA: Diagnosis not present

## 2013-06-04 DIAGNOSIS — D126 Benign neoplasm of colon, unspecified: Secondary | ICD-10-CM | POA: Diagnosis not present

## 2013-06-04 DIAGNOSIS — Z09 Encounter for follow-up examination after completed treatment for conditions other than malignant neoplasm: Secondary | ICD-10-CM | POA: Diagnosis not present

## 2013-06-04 DIAGNOSIS — G8929 Other chronic pain: Secondary | ICD-10-CM | POA: Diagnosis not present

## 2013-06-04 DIAGNOSIS — M545 Low back pain, unspecified: Secondary | ICD-10-CM | POA: Diagnosis not present

## 2013-06-04 DIAGNOSIS — Z1211 Encounter for screening for malignant neoplasm of colon: Secondary | ICD-10-CM | POA: Diagnosis not present

## 2013-06-04 DIAGNOSIS — E785 Hyperlipidemia, unspecified: Secondary | ICD-10-CM | POA: Diagnosis not present

## 2013-06-04 DIAGNOSIS — E7800 Pure hypercholesterolemia, unspecified: Secondary | ICD-10-CM | POA: Diagnosis not present

## 2013-06-04 DIAGNOSIS — K5730 Diverticulosis of large intestine without perforation or abscess without bleeding: Secondary | ICD-10-CM | POA: Diagnosis not present

## 2013-06-04 DIAGNOSIS — F3289 Other specified depressive episodes: Secondary | ICD-10-CM | POA: Diagnosis not present

## 2013-06-04 DIAGNOSIS — Z79899 Other long term (current) drug therapy: Secondary | ICD-10-CM | POA: Diagnosis not present

## 2013-06-04 DIAGNOSIS — K620 Anal polyp: Secondary | ICD-10-CM | POA: Diagnosis not present

## 2013-06-05 LAB — PATHOLOGY REPORT

## 2013-06-28 DIAGNOSIS — H2510 Age-related nuclear cataract, unspecified eye: Secondary | ICD-10-CM | POA: Diagnosis not present

## 2013-07-01 ENCOUNTER — Emergency Department: Payer: Self-pay | Admitting: Emergency Medicine

## 2013-07-01 DIAGNOSIS — I252 Old myocardial infarction: Secondary | ICD-10-CM | POA: Diagnosis not present

## 2013-07-01 DIAGNOSIS — N201 Calculus of ureter: Secondary | ICD-10-CM | POA: Diagnosis not present

## 2013-07-01 DIAGNOSIS — R109 Unspecified abdominal pain: Secondary | ICD-10-CM | POA: Diagnosis not present

## 2013-07-01 DIAGNOSIS — Z880 Allergy status to penicillin: Secondary | ICD-10-CM | POA: Diagnosis not present

## 2013-07-01 DIAGNOSIS — I10 Essential (primary) hypertension: Secondary | ICD-10-CM | POA: Diagnosis not present

## 2013-07-01 DIAGNOSIS — N210 Calculus in bladder: Secondary | ICD-10-CM | POA: Diagnosis not present

## 2013-07-01 DIAGNOSIS — Z951 Presence of aortocoronary bypass graft: Secondary | ICD-10-CM | POA: Diagnosis not present

## 2013-07-01 DIAGNOSIS — Z79899 Other long term (current) drug therapy: Secondary | ICD-10-CM | POA: Diagnosis not present

## 2013-07-01 LAB — CBC
HCT: 45.4 % (ref 40.0–52.0)
HGB: 14.9 g/dL (ref 13.0–18.0)
MCH: 29.4 pg (ref 26.0–34.0)
MCHC: 32.8 g/dL (ref 32.0–36.0)
MCV: 90 fL (ref 80–100)
PLATELETS: 221 10*3/uL (ref 150–440)
RBC: 5.07 10*6/uL (ref 4.40–5.90)
RDW: 13.1 % (ref 11.5–14.5)
WBC: 8 10*3/uL (ref 3.8–10.6)

## 2013-07-01 LAB — URINALYSIS, COMPLETE
BILIRUBIN, UR: NEGATIVE
GLUCOSE, UR: NEGATIVE mg/dL (ref 0–75)
Ketone: NEGATIVE
Leukocyte Esterase: NEGATIVE
Nitrite: NEGATIVE
PH: 6 (ref 4.5–8.0)
Protein: NEGATIVE
Specific Gravity: 1.01 (ref 1.003–1.030)
WBC UR: 2 /HPF (ref 0–5)

## 2013-07-01 LAB — COMPREHENSIVE METABOLIC PANEL
ALT: 35 U/L (ref 12–78)
ANION GAP: 4 — AB (ref 7–16)
Albumin: 3.9 g/dL (ref 3.4–5.0)
Alkaline Phosphatase: 91 U/L
BUN: 20 mg/dL — AB (ref 7–18)
Bilirubin,Total: 0.3 mg/dL (ref 0.2–1.0)
CHLORIDE: 104 mmol/L (ref 98–107)
Calcium, Total: 8.8 mg/dL (ref 8.5–10.1)
Co2: 31 mmol/L (ref 21–32)
Creatinine: 1.36 mg/dL — ABNORMAL HIGH (ref 0.60–1.30)
EGFR (African American): >60
EGFR (Non-African Amer.): 54 — ABNORMAL LOW
Glucose: 120 mg/dL — ABNORMAL HIGH (ref 65–99)
Osmolality: 281 (ref 275–301)
Potassium: 4.1 mmol/L (ref 3.5–5.1)
SGOT(AST): 31 U/L (ref 15–37)
Sodium: 139 mmol/L (ref 136–145)
TOTAL PROTEIN: 7.6 g/dL (ref 6.4–8.2)

## 2013-07-02 DIAGNOSIS — N210 Calculus in bladder: Secondary | ICD-10-CM | POA: Diagnosis not present

## 2013-07-10 ENCOUNTER — Encounter: Payer: Self-pay | Admitting: Internal Medicine

## 2013-08-02 ENCOUNTER — Ambulatory Visit: Payer: Self-pay | Admitting: Urology

## 2013-08-02 DIAGNOSIS — R972 Elevated prostate specific antigen [PSA]: Secondary | ICD-10-CM | POA: Diagnosis not present

## 2013-08-02 DIAGNOSIS — N200 Calculus of kidney: Secondary | ICD-10-CM | POA: Diagnosis not present

## 2013-08-02 DIAGNOSIS — N23 Unspecified renal colic: Secondary | ICD-10-CM | POA: Diagnosis not present

## 2013-08-02 DIAGNOSIS — N201 Calculus of ureter: Secondary | ICD-10-CM | POA: Diagnosis not present

## 2013-08-16 DIAGNOSIS — H2510 Age-related nuclear cataract, unspecified eye: Secondary | ICD-10-CM | POA: Diagnosis not present

## 2013-08-16 DIAGNOSIS — D3130 Benign neoplasm of unspecified choroid: Secondary | ICD-10-CM | POA: Diagnosis not present

## 2013-08-16 DIAGNOSIS — Z961 Presence of intraocular lens: Secondary | ICD-10-CM | POA: Diagnosis not present

## 2013-08-16 DIAGNOSIS — H25019 Cortical age-related cataract, unspecified eye: Secondary | ICD-10-CM | POA: Diagnosis not present

## 2013-09-04 DIAGNOSIS — H5700 Unspecified anomaly of pupillary function: Secondary | ICD-10-CM | POA: Diagnosis not present

## 2013-09-04 DIAGNOSIS — H2510 Age-related nuclear cataract, unspecified eye: Secondary | ICD-10-CM | POA: Diagnosis not present

## 2013-09-04 DIAGNOSIS — H269 Unspecified cataract: Secondary | ICD-10-CM | POA: Diagnosis not present

## 2013-09-05 DIAGNOSIS — H2510 Age-related nuclear cataract, unspecified eye: Secondary | ICD-10-CM | POA: Diagnosis not present

## 2013-09-21 ENCOUNTER — Ambulatory Visit: Payer: No Typology Code available for payment source | Admitting: Internal Medicine

## 2013-10-25 ENCOUNTER — Other Ambulatory Visit: Payer: Self-pay | Admitting: Internal Medicine

## 2013-10-25 NOTE — Telephone Encounter (Signed)
Ok to fill 

## 2013-11-02 DIAGNOSIS — N401 Enlarged prostate with lower urinary tract symptoms: Secondary | ICD-10-CM | POA: Diagnosis not present

## 2013-11-02 DIAGNOSIS — N200 Calculus of kidney: Secondary | ICD-10-CM | POA: Diagnosis not present

## 2013-11-02 DIAGNOSIS — Z87891 Personal history of nicotine dependence: Secondary | ICD-10-CM | POA: Diagnosis not present

## 2013-11-02 DIAGNOSIS — I10 Essential (primary) hypertension: Secondary | ICD-10-CM | POA: Diagnosis not present

## 2013-11-02 DIAGNOSIS — R972 Elevated prostate specific antigen [PSA]: Secondary | ICD-10-CM | POA: Diagnosis not present

## 2013-11-02 DIAGNOSIS — I252 Old myocardial infarction: Secondary | ICD-10-CM | POA: Diagnosis not present

## 2013-11-02 DIAGNOSIS — I2510 Atherosclerotic heart disease of native coronary artery without angina pectoris: Secondary | ICD-10-CM | POA: Diagnosis not present

## 2013-11-02 DIAGNOSIS — Z87442 Personal history of urinary calculi: Secondary | ICD-10-CM | POA: Diagnosis not present

## 2013-11-02 DIAGNOSIS — N210 Calculus in bladder: Secondary | ICD-10-CM | POA: Diagnosis not present

## 2013-11-02 DIAGNOSIS — E785 Hyperlipidemia, unspecified: Secondary | ICD-10-CM | POA: Diagnosis not present

## 2013-11-27 DIAGNOSIS — Z951 Presence of aortocoronary bypass graft: Secondary | ICD-10-CM | POA: Diagnosis not present

## 2013-11-27 DIAGNOSIS — E785 Hyperlipidemia, unspecified: Secondary | ICD-10-CM | POA: Diagnosis not present

## 2013-11-27 DIAGNOSIS — I25810 Atherosclerosis of coronary artery bypass graft(s) without angina pectoris: Secondary | ICD-10-CM | POA: Diagnosis not present

## 2013-11-27 DIAGNOSIS — Z9889 Other specified postprocedural states: Secondary | ICD-10-CM | POA: Diagnosis not present

## 2013-12-29 DIAGNOSIS — J069 Acute upper respiratory infection, unspecified: Secondary | ICD-10-CM | POA: Diagnosis not present

## 2014-06-04 DIAGNOSIS — I25810 Atherosclerosis of coronary artery bypass graft(s) without angina pectoris: Secondary | ICD-10-CM | POA: Diagnosis not present

## 2014-06-04 DIAGNOSIS — I10 Essential (primary) hypertension: Secondary | ICD-10-CM | POA: Diagnosis not present

## 2014-06-04 DIAGNOSIS — Z9889 Other specified postprocedural states: Secondary | ICD-10-CM | POA: Diagnosis not present

## 2014-06-26 DIAGNOSIS — Z885 Allergy status to narcotic agent status: Secondary | ICD-10-CM | POA: Diagnosis not present

## 2014-06-26 DIAGNOSIS — N486 Induration penis plastica: Secondary | ICD-10-CM | POA: Diagnosis not present

## 2014-06-26 DIAGNOSIS — Z720 Tobacco use: Secondary | ICD-10-CM | POA: Diagnosis not present

## 2014-06-26 DIAGNOSIS — N401 Enlarged prostate with lower urinary tract symptoms: Secondary | ICD-10-CM | POA: Diagnosis not present

## 2014-06-26 DIAGNOSIS — I2510 Atherosclerotic heart disease of native coronary artery without angina pectoris: Secondary | ICD-10-CM | POA: Diagnosis not present

## 2014-06-26 DIAGNOSIS — Z91030 Bee allergy status: Secondary | ICD-10-CM | POA: Diagnosis not present

## 2014-06-26 DIAGNOSIS — Z981 Arthrodesis status: Secondary | ICD-10-CM | POA: Diagnosis not present

## 2014-06-26 DIAGNOSIS — Z951 Presence of aortocoronary bypass graft: Secondary | ICD-10-CM | POA: Diagnosis not present

## 2014-06-26 DIAGNOSIS — R972 Elevated prostate specific antigen [PSA]: Secondary | ICD-10-CM | POA: Diagnosis not present

## 2014-06-26 DIAGNOSIS — N200 Calculus of kidney: Secondary | ICD-10-CM | POA: Diagnosis not present

## 2014-06-26 DIAGNOSIS — I10 Essential (primary) hypertension: Secondary | ICD-10-CM | POA: Diagnosis not present

## 2014-06-26 DIAGNOSIS — Z91038 Other insect allergy status: Secondary | ICD-10-CM | POA: Diagnosis not present

## 2014-06-26 DIAGNOSIS — I252 Old myocardial infarction: Secondary | ICD-10-CM | POA: Diagnosis not present

## 2014-07-01 DIAGNOSIS — Z961 Presence of intraocular lens: Secondary | ICD-10-CM | POA: Diagnosis not present

## 2014-10-15 DIAGNOSIS — C44629 Squamous cell carcinoma of skin of left upper limb, including shoulder: Secondary | ICD-10-CM | POA: Diagnosis not present

## 2014-10-15 DIAGNOSIS — X32XXXA Exposure to sunlight, initial encounter: Secondary | ICD-10-CM | POA: Diagnosis not present

## 2014-10-15 DIAGNOSIS — D485 Neoplasm of uncertain behavior of skin: Secondary | ICD-10-CM | POA: Diagnosis not present

## 2014-10-15 DIAGNOSIS — L570 Actinic keratosis: Secondary | ICD-10-CM | POA: Diagnosis not present

## 2014-10-15 DIAGNOSIS — L728 Other follicular cysts of the skin and subcutaneous tissue: Secondary | ICD-10-CM | POA: Diagnosis not present

## 2014-11-08 ENCOUNTER — Telehealth: Payer: Self-pay

## 2014-11-08 NOTE — Telephone Encounter (Signed)
Spoke to patient regarding upcoming AWV on 7/27.  Pt okay to see Health Coach, but wanted to ask you for an Epi-Pen and refill on Elavil.  Would you rather meet with him first to discuss these topics, then send to me?  OR shall I see him for the entire visit? Please advise.

## 2014-11-08 NOTE — Telephone Encounter (Signed)
Called and left message for patient to call back to schedule AWV with Health Coach and keep upcoming f/u appointment with PCP.

## 2014-11-08 NOTE — Telephone Encounter (Signed)
Great!  Called patient back, left message.  Thank you.

## 2014-11-08 NOTE — Telephone Encounter (Signed)
Lets do follow up with me 67min then wellness visit with you a few weeks after

## 2014-11-13 ENCOUNTER — Ambulatory Visit (INDEPENDENT_AMBULATORY_CARE_PROVIDER_SITE_OTHER): Payer: Medicare Other

## 2014-11-13 ENCOUNTER — Other Ambulatory Visit: Payer: Self-pay | Admitting: *Deleted

## 2014-11-13 VITALS — BP 122/72 | HR 68 | Temp 98.3°F | Resp 12 | Ht 71.0 in | Wt 205.0 lb

## 2014-11-13 DIAGNOSIS — E785 Hyperlipidemia, unspecified: Secondary | ICD-10-CM

## 2014-11-13 DIAGNOSIS — Z0000 Encounter for general adult medical examination without abnormal findings: Secondary | ICD-10-CM

## 2014-11-13 NOTE — Patient Instructions (Addendum)
Harry Pacheco,  Thank you for taking time to come for your Medicare Wellness Visit.  I appreciate your ongoing commitment to your health goals. Please review the following plan we discussed and let me know if I can assist you in the future.  Bring a copy of HCPOA to be scanned into medical record during next follow up visit.  Follow up with wife about possible reaction to the pneumonia vaccine.   I have requested labs from Dr. Jacqlyn Larsen.       Health Maintenance A healthy lifestyle and preventative care can promote health and wellness.  Maintain regular health, dental, and eye exams.  Eat a healthy diet. Foods like vegetables, fruits, whole grains, low-fat dairy products, and lean protein foods contain the nutrients you need and are low in calories. Decrease your intake of foods high in solid fats, added sugars, and salt. Get information about a proper diet from your health care provider, if necessary.  Regular physical exercise is one of the most important things you can do for your health. Most adults should get at least 150 minutes of moderate-intensity exercise (any activity that increases your heart rate and causes you to sweat) each week. In addition, most adults need muscle-strengthening exercises on 2 or more days a week.   Maintain a healthy weight. The body mass index (BMI) is a screening tool to identify possible weight problems. It provides an estimate of body fat based on height and weight. Your health care provider can find your BMI and can help you achieve or maintain a healthy weight. For males 20 years and older:  A BMI below 18.5 is considered underweight.  A BMI of 18.5 to 24.9 is normal.  A BMI of 25 to 29.9 is considered overweight.  A BMI of 30 and above is considered obese.  Maintain normal blood lipids and cholesterol by exercising and minimizing your intake of saturated fat. Eat a balanced diet with plenty of fruits and vegetables. Blood tests for lipids and  cholesterol should begin at age 30 and be repeated every 5 years. If your lipid or cholesterol levels are high, you are over age 5, or you are at high risk for heart disease, you may need your cholesterol levels checked more frequently.Ongoing high lipid and cholesterol levels should be treated with medicines if diet and exercise are not working.  If you smoke, find out from your health care provider how to quit. If you do not use tobacco, do not start.  Lung cancer screening is recommended for adults aged 64-80 years who are at high risk for developing lung cancer because of a history of smoking. A yearly low-dose CT scan of the lungs is recommended for people who have at least a 30-pack-year history of smoking and are current smokers or have quit within the past 15 years. A pack year of smoking is smoking an average of 1 pack of cigarettes a day for 1 year (for example, a 30-pack-year history of smoking could mean smoking 1 pack a day for 30 years or 2 packs a day for 15 years). Yearly screening should continue until the smoker has stopped smoking for at least 15 years. Yearly screening should be stopped for people who develop a health problem that would prevent them from having lung cancer treatment.  If you choose to drink alcohol, do not have more than 2 drinks per day. One drink is considered to be 12 oz (360 mL) of beer, 5 oz (150 mL) of wine,  or 1.5 oz (45 mL) of liquor.  Avoid the use of street drugs. Do not share needles with anyone. Ask for help if you need support or instructions about stopping the use of drugs.  High blood pressure causes heart disease and increases the risk of stroke. Blood pressure should be checked at least every 1-2 years. Ongoing high blood pressure should be treated with medicines if weight loss and exercise are not effective.  If you are 75-90 years old, ask your health care provider if you should take aspirin to prevent heart disease.  Diabetes screening involves  taking a blood sample to check your fasting blood sugar level. This should be done once every 3 years after age 41 if you are at a normal weight and without risk factors for diabetes. Testing should be considered at a younger age or be carried out more frequently if you are overweight and have at least 1 risk factor for diabetes.  Colorectal cancer can be detected and often prevented. Most routine colorectal cancer screening begins at the age of 46 and continues through age 83. However, your health care provider may recommend screening at an earlier age if you have risk factors for colon cancer. On a yearly basis, your health care provider may provide home test kits to check for hidden blood in the stool. A small camera at the end of a tube may be used to directly examine the colon (sigmoidoscopy or colonoscopy) to detect the earliest forms of colorectal cancer. Talk to your health care provider about this at age 40 when routine screening begins. A direct exam of the colon should be repeated every 5-10 years through age 45, unless early forms of precancerous polyps or small growths are found.  People who are at an increased risk for hepatitis B should be screened for this virus. You are considered at high risk for hepatitis B if:  You were born in a country where hepatitis B occurs often. Talk with your health care provider about which countries are considered high risk.  Your parents were born in a high-risk country and you have not received a shot to protect against hepatitis B (hepatitis B vaccine).  You have HIV or AIDS.  You use needles to inject street drugs.  You live with, or have sex with, someone who has hepatitis B.  You are a man who has sex with other men (MSM).  You get hemodialysis treatment.  You take certain medicines for conditions like cancer, organ transplantation, and autoimmune conditions.  Hepatitis C blood testing is recommended for all people born from [birth year] through 1963  and any individual with known risk factors for hepatitis C.  Healthy men should no longer receive prostate-specific antigen (PSA) blood tests as part of routine cancer screening. Talk to your health care provider about prostate cancer screening.  Testicular cancer screening is not recommended for adolescents or adult males who have no symptoms. Screening includes self-exam, a health care provider exam, and other screening tests. Consult with your health care provider about any symptoms you have or any concerns you have about testicular cancer.  Practice safe sex. Use condoms and avoid high-risk sexual practices to reduce the spread of sexually transmitted infections (STIs).  You should be screened for STIs, including gonorrhea and chlamydia if:  You are sexually active and are younger than 24 years.  You are older than 24 years, and your health care provider tells you that you are at risk for this type of infection.  Your sexual activity has changed since you were last screened, and you are at an increased risk for chlamydia or gonorrhea. Ask your health care provider if you are at risk.  If you are at risk of being infected with HIV, it is recommended that you take a prescription medicine daily to prevent HIV infection. This is called pre-exposure prophylaxis (PrEP). You are considered at risk if:  You are a man who has sex with other men (MSM).  You are a heterosexual man who is sexually active with multiple partners.  You take drugs by injection.  You are sexually active with a partner who has HIV.  Talk with your health care provider about whether you are at high risk of being infected with HIV. If you choose to begin PrEP, you should first be tested for HIV. You should then be tested every 3 months for as long as you are taking PrEP.  Use sunscreen. Apply sunscreen liberally and repeatedly throughout the day. You should seek shade when your shadow is shorter than you. Protect  yourself by wearing long sleeves, pants, a wide-brimmed hat, and sunglasses year round whenever you are outdoors.  Tell your health care provider of new moles or changes in moles, especially if there is a change in shape or color. Also, tell your health care provider if a mole is larger than the size of a pencil eraser.  A one-time screening for abdominal aortic aneurysm (AAA) and surgical repair of large AAAs by ultrasound is recommended for men aged 40-75 years who are current or former smokers.  Stay current with your vaccines (immunizations). Document Released: 10/02/2007 Document Revised: 04/10/2013 Document Reviewed: 08/31/2010 [facility] Patient Information 2015 [place], [place]. This information is not intended to replace advice given to you by your health care provider. Make sure you discuss any questions you have with your health care provider.

## 2014-11-13 NOTE — Progress Notes (Signed)
Subjective:   Harry Pacheco. is a 69 y.o. male who presents for Medicare Annual/Subsequent preventive examination.  Review of Systems:  No ROS.  Medicare Wellness Visit.  Cardiac Risk Factors include: advanced age (>70men, >77 women);male gender     Objective:    The goal of the wellness visit is to assist the patient how to close the gaps in care and create a preventative care plan for the patient.  Personalized education was given.  Taking meds without issues; no barriers identified.  Labs were ordered.    No Risk for hepatitis or high risk social behavior identified via hepatitis screen.  Educated on shingles and follow up with insurance.  Educated on Vaccines; Patient thinks he had a reaction to pneumonia vaccine in the past. Second injection postponed for follow up.   Safety issues reviewed; Smoke detectors in the home. Firearms in a locked safe.  No violence in the home.  Wears seatbelt when driving or riding with others.  The patient was oriented x 3; appropriate in dress and manner and no objective failures at ADL's or IADL's.   Functional status reviewed; no losses in function x 1 year  Bladder issues reviewed; No leaking. Pressure at times. Followed by Dr.Cope.  End of life planning was discussed; plans to bring copy of HCPOA paperwork to follow up visit.   Vitals: BP 122/72 mmHg  Pulse 68  Temp(Src) 98.3 F (36.8 C) (Oral)  Resp 12  Ht 5\' 11"  (1.803 m)  Wt 205 lb (92.987 kg)  BMI 28.60 kg/m2  SpO2 97%  Tobacco History  Smoking status  . Former Smoker  . Quit date: 06/22/2008  Smokeless tobacco  . Current User     Ready to quit: Not Answered Counseling given: Not Answered   Past Medical History  Diagnosis Date  . Chronic back pain   . Hypertension   . Hyperlipemia    Past Surgical History  Procedure Laterality Date  . Coronary artery bypass graft    . Lumbar spine surgery    . Biceps tendon repair    . Shoulder surgery      Family History  Problem Relation Age of Onset  . Arthritis Mother   . Alcohol abuse Father   . Cancer Father     Prostate - dx in 15's  . Heart disease Maternal Grandmother   . Hypertension Maternal Grandmother   . Heart disease Maternal Grandfather   . Hypertension Maternal Grandfather   . Heart disease Paternal Grandmother   . Hypertension Paternal Grandmother   . Early death Paternal Grandfather 33    MI   . Heart disease Paternal Grandfather   . Hypertension Paternal Grandfather   . Cancer Sister     Sinus, Lung - dx 76's, deceased at 54  . Cancer Brother     Testicular - Dx and deceased in 35's   History  Sexual Activity  . Sexual Activity: Yes    Outpatient Encounter Prescriptions as of 11/13/2014  Medication Sig  . amitriptyline (ELAVIL) 50 MG tablet TAKE 1 TABLET BY MOUTH AT BEDTIME. PLEASE FILL ON 04/09/2013  . aspirin 325 MG tablet Take 325 mg by mouth daily.  Marland Kitchen atenolol (TENORMIN) 100 MG tablet Take 100 mg by mouth daily.  Marland Kitchen losartan (COZAAR) 50 MG tablet   . simvastatin (ZOCOR) 20 MG tablet Take 20 mg by mouth every evening.  . tamsulosin (FLOMAX) 0.4 MG CAPS capsule Take 0.4 mg by mouth.  . colchicine 0.6  MG tablet Take 1.2mg  (2 tablets), then may take 0.6mg  1 hr later if symptoms not improved. Then, use 0.6mg  daily if flare continues. (Patient not taking: Reported on 11/13/2014)  . indomethacin (INDOCIN) 50 MG capsule Take 1 capsule (50 mg total) by mouth 3 (three) times daily with meals. (Patient not taking: Reported on 11/13/2014)  . [DISCONTINUED] azithromycin (ZITHROMAX Z-PAK) 250 MG tablet Take 2 pills day 1, then 1 pill daily days 2-5  . [DISCONTINUED] diazepam (VALIUM) 5 MG tablet TAKE 1 TABLET TWICE A DAY AS NEEDED  . [DISCONTINUED] losartan (COZAAR) 50 MG tablet Take 50 mg by mouth daily.   No facility-administered encounter medications on file as of 11/13/2014.    Activities of Daily Living In your present state of health, do you have any difficulty  performing the following activities: 11/13/2014  Hearing? N  Vision? N  Difficulty concentrating or making decisions? N  Walking or climbing stairs? N  Dressing or bathing? N  Doing errands, shopping? N  Preparing Food and eating ? N  Using the Toilet? N  In the past six months, have you accidently leaked urine? Y  Do you have problems with loss of bowel control? N  Managing your Medications? N  Managing your Finances? N  Housekeeping or managing your Housekeeping? N    Patient Care Team: Jackolyn Confer, MD as PCP - General (Internal Medicine) Isaias Cowman, MD (Cardiology)   Assessment:    Exercise Activities and Dietary recommendations Current Exercise Habits:: Home exercise routine, Type of exercise: Other - see comments (Fishes x2 weekly, physical church activities, mows lawn, rides exercise bike), Time (Minutes): 30, Frequency (Times/Week): 5, Weekly Exercise (Minutes/Week): 150, Intensity: Moderate  Goals    . sleep better     Sleep better by reading more, staying active and continue taking medication as prescribed.      Fall Risk Fall Risk  11/13/2014 03/23/2013  Falls in the past year? No No   Depression Screen PHQ 2/9 Scores 11/13/2014 03/23/2013  PHQ - 2 Score 0 0    Cognitive Testing MMSE - Mini Mental State Exam 11/13/2014  Orientation to time 5  Orientation to Place 5  Registration 3  Attention/ Calculation 5  Recall 3  Language- name 2 objects 2  Language- repeat 1  Language- follow 3 step command 3  Language- read & follow direction 1  Write a sentence 1  Copy design 1  Total score 30    Immunization History  Administered Date(s) Administered  . Influenza Split 01/18/2011  . Influenza,inj,Quad PF,36+ Mos 03/23/2013  . Pneumococcal Polysaccharide-23 03/23/2013   Screening Tests Health Maintenance  Topic Date Due  . ZOSTAVAX  11/12/2015 (Originally 11/04/2007)  . PNA vac Low Risk Adult (2 of 2 - PCV13) 11/12/2015 (Originally 03/23/2014)    . TETANUS/TDAP  11/13/2015 (Originally 11/04/1966)  . INFLUENZA VACCINE  11/18/2014  . COLONOSCOPY  06/05/2023      Plan:    During the course of the visit the patient was educated and counseled about the following appropriate screening and preventive services:   Vaccines to include Pneumoccal, Influenza, Hepatitis B, Td, Zostavax, HCV  Electrocardiogram  Cardiovascular Disease  Colorectal cancer screening  Diabetes screening  Prostate Cancer Screening-followed by Dr. Jacqlyn Larsen  Glaucoma screening  Nutrition counseling   Smoking cessation counseling  Patient Instructions (the written plan) was given to the patient.    Varney Biles, LPN  [id number]

## 2014-11-13 NOTE — Addendum Note (Signed)
Addended by: Karlene Einstein D on: 11/13/2014 04:07 PM   Modules accepted: Orders

## 2014-11-13 NOTE — Progress Notes (Signed)
Annual Wellness Visit as completed by Health Coach was reviewed in full.  

## 2014-11-15 ENCOUNTER — Other Ambulatory Visit (INDEPENDENT_AMBULATORY_CARE_PROVIDER_SITE_OTHER): Payer: Medicare Other

## 2014-11-15 DIAGNOSIS — E785 Hyperlipidemia, unspecified: Secondary | ICD-10-CM | POA: Diagnosis not present

## 2014-11-15 DIAGNOSIS — Z0000 Encounter for general adult medical examination without abnormal findings: Secondary | ICD-10-CM | POA: Diagnosis not present

## 2014-11-15 LAB — COMPREHENSIVE METABOLIC PANEL
ALK PHOS: 62 U/L (ref 39–117)
ALT: 23 U/L (ref 0–53)
AST: 20 U/L (ref 0–37)
Albumin: 4.2 g/dL (ref 3.5–5.2)
BUN: 19 mg/dL (ref 6–23)
CALCIUM: 9.3 mg/dL (ref 8.4–10.5)
CO2: 30 mEq/L (ref 19–32)
CREATININE: 1 mg/dL (ref 0.40–1.50)
Chloride: 105 mEq/L (ref 96–112)
GFR: 79.21 mL/min (ref >60.00)
GLUCOSE: 120 mg/dL — AB (ref 70–99)
Potassium: 4.7 mEq/L (ref 3.5–5.1)
Sodium: 141 mEq/L (ref 135–145)
TOTAL PROTEIN: 7 g/dL (ref 6.0–8.3)
Total Bilirubin: 0.4 mg/dL (ref 0.2–1.2)

## 2014-11-15 LAB — LIPID PANEL
Cholesterol: 129 mg/dL (ref 0–200)
HDL: 28.7 mg/dL — AB (ref >39.00)
LDL Cholesterol: 66 mg/dL (ref 0–99)
NonHDL: 100.45
TRIGLYCERIDES: 170 mg/dL — AB (ref 0.0–149.0)
Total CHOL/HDL Ratio: 5
VLDL: 34 mg/dL (ref 0.0–40.0)

## 2014-11-15 LAB — TSH: TSH: 1.06 u[IU]/mL (ref 0.35–4.50)

## 2014-11-15 LAB — CBC WITH DIFFERENTIAL/PLATELET
Basophils Absolute: 0.1 10*3/uL (ref 0.0–0.1)
Basophils Relative: 0.8 % (ref 0.0–3.0)
EOS ABS: 0.3 10*3/uL (ref 0.0–0.7)
Eosinophils Relative: 4.2 % (ref 0.0–5.0)
HEMATOCRIT: 44 % (ref 39.0–52.0)
HEMOGLOBIN: 14.3 g/dL (ref 13.0–17.0)
LYMPHS ABS: 2.3 10*3/uL (ref 0.7–4.0)
LYMPHS PCT: 31.9 % (ref 12.0–46.0)
MCHC: 32.6 g/dL (ref 30.0–36.0)
MCV: 91.3 fl (ref 78.0–100.0)
MONOS PCT: 8 % (ref 3.0–12.0)
Monocytes Absolute: 0.6 10*3/uL (ref 0.1–1.0)
NEUTROS PCT: 55.1 % (ref 43.0–77.0)
Neutro Abs: 4.1 10*3/uL (ref 1.4–7.7)
Platelets: 212 10*3/uL (ref 150.0–400.0)
RBC: 4.82 Mil/uL (ref 4.22–5.81)
RDW: 13.6 % (ref 11.5–15.5)
WBC: 7.4 10*3/uL (ref 4.0–10.5)

## 2014-11-18 ENCOUNTER — Encounter: Payer: Self-pay | Admitting: *Deleted

## 2014-11-21 DIAGNOSIS — I25810 Atherosclerosis of coronary artery bypass graft(s) without angina pectoris: Secondary | ICD-10-CM | POA: Diagnosis not present

## 2014-11-21 DIAGNOSIS — I10 Essential (primary) hypertension: Secondary | ICD-10-CM | POA: Diagnosis not present

## 2014-11-21 DIAGNOSIS — E780 Pure hypercholesterolemia: Secondary | ICD-10-CM | POA: Diagnosis not present

## 2014-11-21 DIAGNOSIS — Z951 Presence of aortocoronary bypass graft: Secondary | ICD-10-CM | POA: Diagnosis not present

## 2014-12-02 DIAGNOSIS — C44329 Squamous cell carcinoma of skin of other parts of face: Secondary | ICD-10-CM | POA: Diagnosis not present

## 2014-12-02 DIAGNOSIS — D485 Neoplasm of uncertain behavior of skin: Secondary | ICD-10-CM | POA: Diagnosis not present

## 2014-12-02 DIAGNOSIS — C44629 Squamous cell carcinoma of skin of left upper limb, including shoulder: Secondary | ICD-10-CM | POA: Diagnosis not present

## 2014-12-02 DIAGNOSIS — L905 Scar conditions and fibrosis of skin: Secondary | ICD-10-CM | POA: Diagnosis not present

## 2014-12-06 DIAGNOSIS — C44329 Squamous cell carcinoma of skin of other parts of face: Secondary | ICD-10-CM | POA: Diagnosis not present

## 2014-12-06 DIAGNOSIS — L905 Scar conditions and fibrosis of skin: Secondary | ICD-10-CM | POA: Diagnosis not present

## 2014-12-30 ENCOUNTER — Encounter: Payer: Self-pay | Admitting: Internal Medicine

## 2014-12-30 ENCOUNTER — Ambulatory Visit (INDEPENDENT_AMBULATORY_CARE_PROVIDER_SITE_OTHER): Payer: Medicare Other | Admitting: Internal Medicine

## 2014-12-30 VITALS — BP 163/84 | HR 80 | Temp 98.0°F | Ht 71.0 in | Wt 203.2 lb

## 2014-12-30 DIAGNOSIS — I10 Essential (primary) hypertension: Secondary | ICD-10-CM | POA: Diagnosis not present

## 2014-12-30 DIAGNOSIS — M62830 Muscle spasm of back: Secondary | ICD-10-CM

## 2014-12-30 DIAGNOSIS — E785 Hyperlipidemia, unspecified: Secondary | ICD-10-CM | POA: Diagnosis not present

## 2014-12-30 MED ORDER — LOSARTAN POTASSIUM 50 MG PO TABS: 50.0000 mg | ORAL_TABLET | Freq: Every day | ORAL | Status: AC

## 2014-12-30 MED ORDER — SIMVASTATIN 20 MG PO TABS: 20.0000 mg | ORAL_TABLET | Freq: Every evening | ORAL | Status: AC

## 2014-12-30 NOTE — Patient Instructions (Signed)
Continue current medications. 

## 2014-12-30 NOTE — Assessment & Plan Note (Signed)
Lab Results  Component Value Date   LDLCALC 66 11/15/2014   Lipids well controlled on Simvastatin. Will continue.

## 2014-12-30 NOTE — Assessment & Plan Note (Signed)
Symptoms currently well controlled. If recurrent symptoms, will try prn Diazepam.

## 2014-12-30 NOTE — Progress Notes (Signed)
Subjective:    Patient ID: Harry Pacheco., male    DOB: July 05, 1945, 69 y.o.   MRN: [id number]  HPI  69YO male presents for follow up.  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 Evorn Gong.  HTN - Compliant with medications. No CP, HA. Follows up regularly with Dr. Josefa Half in Cardiology. BP at home mostly 130s/70s.  Stopped taking Diazepam for muscle pain. Tolerating symptoms well without medication.   BP Readings from Last 3 Encounters:  12/30/14 163/84  11/13/14 122/72  03/23/13 140/80   Wt Readings from Last 3 Encounters:  12/30/14 203 lb 4 oz (92.194 kg)  11/13/14 205 lb (92.987 kg)  03/23/13 200 lb (90.719 kg)      Past Medical History  Diagnosis Date  . Chronic back pain   . Hypertension   . Hyperlipemia    Family History  Problem Relation Age of Onset  . Arthritis Mother   . Alcohol abuse Father   . Cancer Father     Prostate - dx in 38's  . Heart disease Maternal Grandmother   . Hypertension Maternal Grandmother   . Heart disease Maternal Grandfather   . Hypertension Maternal Grandfather   . Heart disease Paternal Grandmother   . Hypertension Paternal Grandmother   . Early death Paternal Grandfather 29    MI   . Heart disease Paternal Grandfather   . Hypertension Paternal Grandfather   . Cancer Sister     Sinus, Lung - dx 24's, deceased at 41  . Cancer Brother     Testicular - Dx and deceased in 60's   Past Surgical History  Procedure Laterality Date  . Coronary artery bypass graft    . Lumbar spine surgery    . Biceps tendon repair    . Shoulder surgery     Social History   Social History  . Marital Status: Married    Spouse Name: N/A  . Number of Children: 2  . Years of Education: N/A   Occupational History  . Retired    Social History Main Topics  . Smoking status: Former Smoker    Quit date: 06/22/2008  . Smokeless tobacco: Current User  . Alcohol Use: No  . Drug Use: No  . Sexual Activity: Yes   Other Topics  Concern  . None   Social History Narrative   Regular Exercise -  Walk daily   Daily Caffeine Use:  2 cups AM, sweet tea x 1          Review of Systems  Constitutional: Negative for fever, chills, activity change, appetite change, fatigue and unexpected weight change.  Eyes: Negative for visual disturbance.  Respiratory: Negative for cough and shortness of breath.   Cardiovascular: Negative for chest pain, palpitations and leg swelling.  Gastrointestinal: Negative for nausea, vomiting, abdominal pain, diarrhea, constipation and abdominal distention.  Genitourinary: Negative for dysuria, urgency and difficulty urinating.  Musculoskeletal: Negative for myalgias, arthralgias and gait problem.  Skin: Negative for color change and rash.  Neurological: Negative for weakness.  Hematological: Negative for adenopathy.  Psychiatric/Behavioral: Negative for sleep disturbance and dysphoric mood. The patient is not nervous/anxious.        Objective:    BP 163/84 mmHg  Pulse 80  Temp(Src) 98 F (36.7 C) (Oral)  Ht 5\' 11"  (1.803 m)  Wt 203 lb 4 oz (92.194 kg)  BMI 28.36 kg/m2  SpO2 98% Physical Exam  Constitutional: He is oriented to person, place, and time. He  appears well-developed and well-nourished. No distress.  HENT:  Head: Normocephalic and atraumatic.  Right Ear: External ear normal.  Left Ear: External ear normal.  Nose: Nose normal.  Mouth/Throat: Oropharynx is clear and moist. No oropharyngeal exudate.  Eyes: Conjunctivae and EOM are normal. Pupils are equal, round, and reactive to light. Right eye exhibits no discharge. Left eye exhibits no discharge. No scleral icterus.  Neck: Normal range of motion. Neck supple. No tracheal deviation present. No thyromegaly present.  Cardiovascular: Normal rate, regular rhythm and normal heart sounds.  Exam reveals no gallop and no friction rub.   No murmur heard. Pulmonary/Chest: Effort normal and breath sounds normal. No accessory  muscle usage. No tachypnea. No respiratory distress. He has no decreased breath sounds. He has no wheezes. He has no rhonchi. He has no rales. He exhibits no tenderness.  Musculoskeletal: Normal range of motion. He exhibits no edema.  Lymphadenopathy:    He has no cervical adenopathy.  Neurological: He is alert and oriented to person, place, and time. No cranial nerve deficit. Coordination normal.  Skin: Skin is warm and dry. No rash noted. He is not diaphoretic. No erythema. No pallor.  Psychiatric: He has a normal mood and affect. His behavior is normal. Judgment and thought content normal.          Assessment & Plan:   Problem List Items Addressed This Visit      Unprioritized   Essential hypertension, benign - Primary    BP Readings from Last 3 Encounters:  12/30/14 163/84  11/13/14 122/72  03/23/13 140/80   BP elevated today, however generally well controlled at home. Will continue to monitor. Continue Losartan. Renal function on recent labs was normal.      Relevant Medications   EPIPEN 2-PAK 0.3 MG/0.3ML SOAJ injection   losartan (COZAAR) 50 MG tablet   simvastatin (ZOCOR) 20 MG tablet   Hyperlipidemia    Lab Results  Component Value Date   LDLCALC 66 11/15/2014   Lipids well controlled on Simvastatin. Will continue.      Relevant Medications   EPIPEN 2-PAK 0.3 MG/0.3ML SOAJ injection   losartan (COZAAR) 50 MG tablet   simvastatin (ZOCOR) 20 MG tablet   Muscle spasm of back    Symptoms currently well controlled. If recurrent symptoms, will try prn Diazepam.          Return in about 1 year (around 12/30/2015) for Recheck.

## 2014-12-30 NOTE — Progress Notes (Signed)
Pre visit review using our clinic review tool, if applicable. No additional management support is needed unless otherwise documented below in the visit note. 

## 2014-12-30 NOTE — Assessment & Plan Note (Signed)
BP Readings from Last 3 Encounters:  12/30/14 163/84  11/13/14 122/72  03/23/13 140/80   BP elevated today, however generally well controlled at home. Will continue to monitor. Continue Losartan. Renal function on recent labs was normal.

## 2015-03-07 DIAGNOSIS — Z85828 Personal history of other malignant neoplasm of skin: Secondary | ICD-10-CM | POA: Diagnosis not present

## 2015-03-07 DIAGNOSIS — L570 Actinic keratosis: Secondary | ICD-10-CM | POA: Diagnosis not present

## 2015-03-07 DIAGNOSIS — X32XXXA Exposure to sunlight, initial encounter: Secondary | ICD-10-CM | POA: Diagnosis not present

## 2015-03-07 DIAGNOSIS — D2339 Other benign neoplasm of skin of other parts of face: Secondary | ICD-10-CM | POA: Diagnosis not present

## 2015-03-07 DIAGNOSIS — Z08 Encounter for follow-up examination after completed treatment for malignant neoplasm: Secondary | ICD-10-CM | POA: Diagnosis not present

## 2015-04-17 DIAGNOSIS — D485 Neoplasm of uncertain behavior of skin: Secondary | ICD-10-CM | POA: Diagnosis not present

## 2015-04-17 DIAGNOSIS — R208 Other disturbances of skin sensation: Secondary | ICD-10-CM | POA: Diagnosis not present

## 2015-04-17 DIAGNOSIS — L929 Granulomatous disorder of the skin and subcutaneous tissue, unspecified: Secondary | ICD-10-CM | POA: Diagnosis not present

## 2015-05-26 DIAGNOSIS — Z951 Presence of aortocoronary bypass graft: Secondary | ICD-10-CM | POA: Diagnosis not present

## 2015-05-26 DIAGNOSIS — E7800 Pure hypercholesterolemia, unspecified: Secondary | ICD-10-CM | POA: Diagnosis not present

## 2015-05-26 DIAGNOSIS — I10 Essential (primary) hypertension: Secondary | ICD-10-CM | POA: Diagnosis not present

## 2015-05-26 DIAGNOSIS — I2510 Atherosclerotic heart disease of native coronary artery without angina pectoris: Secondary | ICD-10-CM | POA: Diagnosis not present

## 2015-05-26 DIAGNOSIS — Z9889 Other specified postprocedural states: Secondary | ICD-10-CM | POA: Diagnosis not present

## 2015-07-03 DIAGNOSIS — H26499 Other secondary cataract, unspecified eye: Secondary | ICD-10-CM | POA: Diagnosis not present

## 2015-07-18 DIAGNOSIS — N486 Induration penis plastica: Secondary | ICD-10-CM | POA: Diagnosis not present

## 2015-07-18 DIAGNOSIS — N138 Other obstructive and reflux uropathy: Secondary | ICD-10-CM | POA: Diagnosis not present

## 2015-07-18 DIAGNOSIS — R972 Elevated prostate specific antigen [PSA]: Secondary | ICD-10-CM | POA: Diagnosis not present

## 2015-07-18 DIAGNOSIS — N401 Enlarged prostate with lower urinary tract symptoms: Secondary | ICD-10-CM | POA: Diagnosis not present

## 2015-07-18 DIAGNOSIS — N200 Calculus of kidney: Secondary | ICD-10-CM | POA: Diagnosis not present

## 2015-07-18 DIAGNOSIS — N3289 Other specified disorders of bladder: Secondary | ICD-10-CM | POA: Diagnosis not present

## 2015-11-14 ENCOUNTER — Ambulatory Visit: Payer: No Typology Code available for payment source

## 2015-11-14 DIAGNOSIS — L570 Actinic keratosis: Secondary | ICD-10-CM | POA: Diagnosis not present

## 2015-11-14 DIAGNOSIS — X32XXXA Exposure to sunlight, initial encounter: Secondary | ICD-10-CM | POA: Diagnosis not present

## 2015-11-14 DIAGNOSIS — D2261 Melanocytic nevi of right upper limb, including shoulder: Secondary | ICD-10-CM | POA: Diagnosis not present

## 2015-11-14 DIAGNOSIS — I10 Essential (primary) hypertension: Secondary | ICD-10-CM | POA: Diagnosis not present

## 2015-11-14 DIAGNOSIS — D225 Melanocytic nevi of trunk: Secondary | ICD-10-CM | POA: Diagnosis not present

## 2015-11-14 DIAGNOSIS — D0439 Carcinoma in situ of skin of other parts of face: Secondary | ICD-10-CM | POA: Diagnosis not present

## 2015-11-14 DIAGNOSIS — D2272 Melanocytic nevi of left lower limb, including hip: Secondary | ICD-10-CM | POA: Diagnosis not present

## 2015-11-14 DIAGNOSIS — D485 Neoplasm of uncertain behavior of skin: Secondary | ICD-10-CM | POA: Diagnosis not present

## 2015-11-14 DIAGNOSIS — Z85828 Personal history of other malignant neoplasm of skin: Secondary | ICD-10-CM | POA: Diagnosis not present

## 2015-11-14 DIAGNOSIS — Z86010 Personal history of colonic polyps: Secondary | ICD-10-CM | POA: Diagnosis not present

## 2015-12-02 DIAGNOSIS — Z9889 Other specified postprocedural states: Secondary | ICD-10-CM | POA: Diagnosis not present

## 2015-12-02 DIAGNOSIS — Z01810 Encounter for preprocedural cardiovascular examination: Secondary | ICD-10-CM | POA: Diagnosis not present

## 2015-12-02 DIAGNOSIS — I10 Essential (primary) hypertension: Secondary | ICD-10-CM | POA: Diagnosis not present

## 2015-12-02 DIAGNOSIS — I2510 Atherosclerotic heart disease of native coronary artery without angina pectoris: Secondary | ICD-10-CM | POA: Diagnosis not present

## 2015-12-02 DIAGNOSIS — Z951 Presence of aortocoronary bypass graft: Secondary | ICD-10-CM | POA: Diagnosis not present

## 2015-12-02 DIAGNOSIS — E7800 Pure hypercholesterolemia, unspecified: Secondary | ICD-10-CM | POA: Diagnosis not present

## 2016-01-05 DIAGNOSIS — L908 Other atrophic disorders of skin: Secondary | ICD-10-CM | POA: Diagnosis not present

## 2016-01-05 DIAGNOSIS — L578 Other skin changes due to chronic exposure to nonionizing radiation: Secondary | ICD-10-CM | POA: Diagnosis not present

## 2016-01-05 DIAGNOSIS — L814 Other melanin hyperpigmentation: Secondary | ICD-10-CM | POA: Diagnosis not present

## 2016-01-05 DIAGNOSIS — D0439 Carcinoma in situ of skin of other parts of face: Secondary | ICD-10-CM | POA: Diagnosis not present

## 2016-01-26 DIAGNOSIS — Z23 Encounter for immunization: Secondary | ICD-10-CM | POA: Diagnosis not present

## 2016-02-02 ENCOUNTER — Encounter: Payer: Self-pay | Admitting: *Deleted

## 2016-02-03 ENCOUNTER — Ambulatory Visit
Admission: RE | Admit: 2016-02-03 | Discharge: 2016-02-03 | Disposition: A | Discharge status: Home / Self Care | Payer: Medicare Other | Source: Ambulatory Visit | Attending: Gastroenterology | Admitting: Gastroenterology

## 2016-02-03 ENCOUNTER — Encounter
Admission: RE | Disposition: A | Discharge status: Home / Self Care | Payer: Self-pay | Source: Ambulatory Visit | Attending: Gastroenterology

## 2016-02-03 ENCOUNTER — Encounter: Payer: Self-pay | Admitting: Anesthesiology

## 2016-02-03 ENCOUNTER — Ambulatory Visit: Payer: Medicare Other | Admitting: Anesthesiology

## 2016-02-03 DIAGNOSIS — N400 Enlarged prostate without lower urinary tract symptoms: Secondary | ICD-10-CM | POA: Insufficient documentation

## 2016-02-03 DIAGNOSIS — I2510 Atherosclerotic heart disease of native coronary artery without angina pectoris: Secondary | ICD-10-CM | POA: Insufficient documentation

## 2016-02-03 DIAGNOSIS — D124 Benign neoplasm of descending colon: Secondary | ICD-10-CM | POA: Diagnosis not present

## 2016-02-03 DIAGNOSIS — Z91030 Bee allergy status: Secondary | ICD-10-CM | POA: Diagnosis not present

## 2016-02-03 DIAGNOSIS — E785 Hyperlipidemia, unspecified: Secondary | ICD-10-CM | POA: Insufficient documentation

## 2016-02-03 DIAGNOSIS — Z87891 Personal history of nicotine dependence: Secondary | ICD-10-CM | POA: Insufficient documentation

## 2016-02-03 DIAGNOSIS — Z86010 Personal history of colonic polyps: Secondary | ICD-10-CM | POA: Diagnosis not present

## 2016-02-03 DIAGNOSIS — D123 Benign neoplasm of transverse colon: Secondary | ICD-10-CM | POA: Diagnosis not present

## 2016-02-03 DIAGNOSIS — Z79899 Other long term (current) drug therapy: Secondary | ICD-10-CM | POA: Diagnosis not present

## 2016-02-03 DIAGNOSIS — I10 Essential (primary) hypertension: Secondary | ICD-10-CM | POA: Diagnosis not present

## 2016-02-03 DIAGNOSIS — M549 Dorsalgia, unspecified: Secondary | ICD-10-CM | POA: Insufficient documentation

## 2016-02-03 DIAGNOSIS — K635 Polyp of colon: Secondary | ICD-10-CM | POA: Diagnosis not present

## 2016-02-03 DIAGNOSIS — K5790 Diverticulosis of intestine, part unspecified, without perforation or abscess without bleeding: Secondary | ICD-10-CM | POA: Diagnosis not present

## 2016-02-03 DIAGNOSIS — Z1211 Encounter for screening for malignant neoplasm of colon: Secondary | ICD-10-CM | POA: Diagnosis not present

## 2016-02-03 DIAGNOSIS — Z7982 Long term (current) use of aspirin: Secondary | ICD-10-CM | POA: Diagnosis not present

## 2016-02-03 DIAGNOSIS — D122 Benign neoplasm of ascending colon: Secondary | ICD-10-CM | POA: Diagnosis not present

## 2016-02-03 DIAGNOSIS — G8929 Other chronic pain: Secondary | ICD-10-CM | POA: Diagnosis not present

## 2016-02-03 DIAGNOSIS — Z87892 Personal history of anaphylaxis: Secondary | ICD-10-CM | POA: Diagnosis not present

## 2016-02-03 DIAGNOSIS — K5730 Diverticulosis of large intestine without perforation or abscess without bleeding: Secondary | ICD-10-CM | POA: Insufficient documentation

## 2016-02-03 DIAGNOSIS — D125 Benign neoplasm of sigmoid colon: Secondary | ICD-10-CM | POA: Diagnosis not present

## 2016-02-03 DIAGNOSIS — Z951 Presence of aortocoronary bypass graft: Secondary | ICD-10-CM | POA: Insufficient documentation

## 2016-02-03 DIAGNOSIS — Z880 Allergy status to penicillin: Secondary | ICD-10-CM | POA: Insufficient documentation

## 2016-02-03 HISTORY — PX: COLONOSCOPY WITH PROPOFOL: SHX5780

## 2016-02-03 SURGERY — COLONOSCOPY WITH PROPOFOL
Anesthesia: General

## 2016-02-03 MED ORDER — PROPOFOL 500 MG/50ML IV EMUL
INTRAVENOUS | Status: DC | PRN
  Administered 2016-02-03: 140 ug/kg/min via INTRAVENOUS

## 2016-02-03 MED ORDER — FENTANYL CITRATE (PF) 100 MCG/2ML IJ SOLN
INTRAMUSCULAR | Status: DC | PRN
  Administered 2016-02-03: 50 ug via INTRAVENOUS

## 2016-02-03 MED ORDER — SODIUM CHLORIDE 0.9 % IV SOLN: INTRAVENOUS | Status: DC

## 2016-02-03 MED ORDER — MIDAZOLAM HCL 2 MG/2ML IJ SOLN
INTRAMUSCULAR | Status: DC | PRN
  Administered 2016-02-03: 1 mg via INTRAVENOUS

## 2016-02-03 MED ORDER — SODIUM CHLORIDE 0.9 % IV SOLN
INTRAVENOUS | Status: DC
  Administered 2016-02-03: 1000 mL via INTRAVENOUS

## 2016-02-03 NOTE — Anesthesia Preprocedure Evaluation (Signed)
Anesthesia Evaluation  Patient identified by MRN, date of birth, ID band Patient awake    Reviewed: Allergy & Precautions, NPO status , Patient's Chart, lab work & pertinent test results  Airway Mallampati: II       Dental  (+) Teeth Intact, Partial Lower   Pulmonary neg pulmonary ROS, former smoker,    breath sounds clear to auscultation       Cardiovascular Exercise Tolerance: Good hypertension, Pt. on home beta blockers + CABG   Rhythm:Regular Rate:Normal     Neuro/Psych negative neurological ROS     GI/Hepatic negative GI ROS, Neg liver ROS,   Endo/Other  negative endocrine ROS  Renal/GU negative Renal ROS     Musculoskeletal negative musculoskeletal ROS (+)   Abdominal Normal abdominal exam  (+)   Peds  Hematology negative hematology ROS (+)   Anesthesia Other Findings   Reproductive/Obstetrics                             Anesthesia Physical Anesthesia Plan  ASA: II  Anesthesia Plan: General   Post-op Pain Management:    Induction: Intravenous  Airway Management Planned: Natural Airway and Nasal Cannula  Additional Equipment:   Intra-op Plan:   Post-operative Plan:   Informed Consent: I have reviewed the patients History and Physical, chart, labs and discussed the procedure including the risks, benefits and alternatives for the proposed anesthesia with the patient or authorized representative who has indicated his/her understanding and acceptance.     Plan Discussed with: CRNA  Anesthesia Plan Comments:         Anesthesia Quick Evaluation

## 2016-02-03 NOTE — H&P (Signed)
Outpatient short stay form Pre-procedure 02/03/2016 8:35 AM Lollie Sails MD  Primary Physician:  Dr Tracie Harrier, Dr. Aris [place]  Reason for visit:  Colonoscopy  History of present illness:  Patient is a 70 year old male presenting today as above. He has a personal history of adenomatous colon polyps. His prep well. He does take a 325 mg aspirin daily but has held that for several days. This is in regards to his personal history of coronary artery disease. He takes no other aspirin products or blood thinning agents.    Current Facility-Administered Medications:  .  0.9 %  sodium chloride infusion, , Intravenous, Continuous, Lollie Sails, MD, Last Rate: 20 mL/hr at 02/03/16 0747, 1,000 mL at 02/03/16 0747 .  0.9 %  sodium chloride infusion, , Intravenous, Continuous, Lollie Sails, MD  Prescriptions Prior to Admission  Medication Sig Dispense Refill Last Dose  . amitriptyline (ELAVIL) 50 MG tablet TAKE 1 TABLET BY MOUTH AT BEDTIME. PLEASE FILL ON 04/09/2013 30 tablet 6 02/02/2016 at Unknown time  . aspirin 325 MG tablet Take 325 mg by mouth daily.   02/02/2016 at Unknown time  . atenolol (TENORMIN) 100 MG tablet Take 100 mg by mouth daily.   02/02/2016 at Unknown time  . colchicine 0.6 MG tablet Take 1.2mg  (2 tablets), then may take 0.6mg  1 hr later if symptoms not improved. Then, use 0.6mg  daily if flare continues. 30 tablet 3 02/02/2016 at Unknown time  . EPIPEN 2-PAK 0.3 MG/0.3ML SOAJ injection INJECT  INTO THE MUSCLE ONCE PRF ANAPHYLAXIS  1 02/02/2016 at Unknown time  . indomethacin (INDOCIN) 50 MG capsule Take 1 capsule (50 mg total) by mouth 3 (three) times daily with meals. 30 capsule 3 02/02/2016 at Unknown time  . simvastatin (ZOCOR) 20 MG tablet Take 1 tablet (20 mg total) by mouth every evening. 90 tablet 3 02/02/2016 at Unknown time  . tamsulosin (FLOMAX) 0.4 MG CAPS capsule Take 0.4 mg by mouth.   02/02/2016 at Unknown time  . losartan (COZAAR) 50 MG tablet  Take 1 tablet (50 mg total) by mouth daily. 90 tablet 3      Allergies  Allergen Reactions  . Bee Venom Anaphylaxis  . Penicillins Rash     Past Medical History:  Diagnosis Date  . Chronic back pain   . Hyperlipemia   . Hypertension     Review of systems:      Physical Exam    Heart and lungs: Regular rate and rhythm without rub or gallop, lungs are bilaterally clear.    HEENT: Normocephalic atraumatic eyes are anicteric    Other:     Pertinant exam for procedure: Soft nontender nondistended bowel sounds positive normoactive.    Planned proceedures: Colonoscopy and indicated procedures. I have discussed the risks benefits and complications of procedures to include not limited to bleeding, infection, perforation and the risk of sedation and the patient wishes to proceed.    Lollie Sails, MD Gastroenterology 02/03/2016  8:35 AM

## 2016-02-03 NOTE — Anesthesia Postprocedure Evaluation (Signed)
Anesthesia Post Note  Patient: Harry CORRIS Sr.  Procedure(s) Performed: Procedure(s) (LRB): COLONOSCOPY WITH PROPOFOL (N/A)  Patient location during evaluation: PACU Anesthesia Type: General Level of consciousness: awake Pain management: pain level controlled Vital Signs Assessment: post-procedure vital signs reviewed and stable Respiratory status: spontaneous breathing Cardiovascular status: stable Anesthetic complications: no    Last Vitals:  Vitals:   02/03/16 1010 02/03/16 1020  BP: 139/74 (!) 154/89  Pulse:    Temp:      Last Pain:  Vitals:   02/03/16 0950  TempSrc: Tympanic                 VAN STAVEREN,Adilenne Ashworth

## 2016-02-03 NOTE — Anesthesia Procedure Notes (Signed)
Performed by: COOK-MARTIN, Peja Allender Pre-anesthesia Checklist: Patient identified, Emergency Drugs available, Suction available, Patient being monitored and Timeout performed Patient Re-evaluated:Patient Re-evaluated prior to inductionOxygen Delivery Method: Nasal cannula Preoxygenation: Pre-oxygenation with 100% oxygen Intubation Type: IV induction Placement Confirmation: positive ETCO2 and CO2 detector       

## 2016-02-03 NOTE — Op Note (Signed)
[facility] Gastroenterology Patient Name: Harry Pacheco Procedure Date: 02/03/2016 8:38 AM MRN: [id number] Account #: [id number] Date of Birth: 02/02/1946 Admit Type: Outpatient Age: 70 Room: [facility] ENDO ROOM 3 Gender: Male Note Status: Finalized Procedure:            Colonoscopy Indications:          Personal history of colonic polyps Providers:            Lollie Sails, MD Referring MD:         Tracie Harrier, MD (Referring MD) Medicines:            Monitored Anesthesia Care Complications:        No immediate complications. Procedure:            Pre-Anesthesia Assessment:                       - ASA Grade Assessment: III - A patient with severe                        systemic disease.                       After obtaining informed consent, the colonoscope was                        passed under direct vision. Throughout the procedure,                        the patient's blood pressure, pulse, and oxygen                        saturations were monitored continuously. The                        Colonoscope was introduced through the anus and                        advanced to the the cecum, identified by the ileocecal                        valve. The colonoscopy was unusually difficult due to                        significant looping and a tortuous colon. Successful                        completion of the procedure was aided by changing the                        patient to a supine position, changing the patient to a                        prone position, using manual pressure and withdrawing                        and reinserting the scope. The patient tolerated the                        procedure well. The quality of the bowel preparation  was fair. Findings:      A 2 mm polyp was found in the ascending colon. The polyp was sessile.       The polyp was removed with a cold biopsy forceps. Resection and       retrieval were  complete.      A 3 mm polyp was found in the transverse colon. The polyp was sessile.       The polyp was removed with a cold biopsy forceps. Resection and       retrieval were complete.      Two sessile polyps were found in the proximal ascending colon. The       polyps were 2 to 3 mm in size. These polyps were removed with a cold       biopsy forceps. Resection and retrieval were complete.      Two sessile polyps were found in the sigmoid colon. The polyps were 1 to       2 mm in size. These polyps were removed with a cold biopsy forceps.       Resection and retrieval were complete.      A 2 mm polyp was found in the descending colon. The polyp was sessile.       The polyp was removed with a cold biopsy forceps. Resection and       retrieval were complete.      Multiple small and large-mouthed diverticula were found in the sigmoid       colon and distal descending colon.      The retroflexed view of the distal rectum and anal verge was normal and       showed no anal or rectal abnormalities.      The digital rectal exam findings include enlarged prostate.      I was able to attain and evaluate the distal IC valve, and part of the       cecum, but was unable to evaluate the base of the cecum, however seen       from a distance no abnormality was noted. The left colon is very       redundant. Impression:           - Preparation of the colon was fair.                       - One 2 mm polyp in the ascending colon, removed with a                        cold biopsy forceps. Resected and retrieved.                       - One 3 mm polyp in the transverse colon, removed with                        a cold biopsy forceps. Resected and retrieved.                       - Two 2 to 3 mm polyps in the proximal ascending colon,                        removed with a cold biopsy forceps. Resected and  retrieved.                       - Two 1 to 2 mm polyps in the sigmoid colon,  removed                        with a cold biopsy forceps. Resected and retrieved.                       - One 2 mm polyp in the descending colon, removed with                        a cold biopsy forceps. Resected and retrieved.                       - Diverticulosis in the sigmoid colon and in the distal                        descending colon.                       - The distal rectum and anal verge are normal on                        retroflexion view.                       - Enlarged prostate found on digital rectal exam. Recommendation:       - Await pathology results.                       - Repeat colonoscopy in 2 years for surveillance based                        on pathology results. Procedure Code(s):    --- Professional ---                       [phone], Colonoscopy, flexible; with biopsy, single or                        multiple Diagnosis Code(s):    --- Professional ---                       D12.2, Benign neoplasm of ascending colon                       D12.3, Benign neoplasm of transverse colon (hepatic                        flexure or splenic flexure)                       D12.4, Benign neoplasm of descending colon                       D12.5, Benign neoplasm of sigmoid colon                       Z86.010, Personal history of colonic polyps  K57.30, Diverticulosis of large intestine without                        perforation or abscess without bleeding                       N40.0, Benign prostatic hyperplasia without lower                        urinary tract symptoms CPT copyright 2016 [employer] Medical [employer]. All rights reserved. The codes documented in this report are preliminary and upon coder review may  be revised to meet current compliance requirements. Lollie Sails, MD 02/03/2016 9:42:52 AM This report has been signed electronically. Number of Addenda: 0 Note Initiated On: 02/03/2016 8:38 AM Scope Withdrawal Time: 0 hours 11  minutes 56 seconds  Total Procedure Duration: 0 hours 49 minutes 45 seconds       [facility]

## 2016-02-03 NOTE — Transfer of Care (Signed)
Immediate Anesthesia Transfer of Care Note  Patient: Harry Pacheco.  Procedure(s) Performed: Procedure(s): COLONOSCOPY WITH PROPOFOL (N/A)  Patient Location: PACU  Anesthesia Type:General  Level of Consciousness: awake and sedated  Airway & Oxygen Therapy: Patient Spontanous Breathing and Patient connected to nasal cannula oxygen  Post-op Assessment: Report given to RN and Post -op Vital signs reviewed and stable  Post vital signs: Reviewed and stable  Last Vitals:  Vitals:   02/03/16 0736  BP: (!) 145/89  Pulse: 75  Temp: (!) 35.9 C    Last Pain:  Vitals:   02/03/16 0736  TempSrc: Tympanic         Complications: No apparent anesthesia complications

## 2016-02-04 LAB — SURGICAL PATHOLOGY

## 2016-02-05 DIAGNOSIS — N200 Calculus of kidney: Secondary | ICD-10-CM | POA: Diagnosis not present

## 2016-02-05 DIAGNOSIS — R972 Elevated prostate specific antigen [PSA]: Secondary | ICD-10-CM | POA: Diagnosis not present

## 2016-02-05 DIAGNOSIS — N401 Enlarged prostate with lower urinary tract symptoms: Secondary | ICD-10-CM | POA: Diagnosis not present

## 2016-02-05 DIAGNOSIS — N486 Induration penis plastica: Secondary | ICD-10-CM | POA: Diagnosis not present

## 2016-02-05 DIAGNOSIS — N138 Other obstructive and reflux uropathy: Secondary | ICD-10-CM | POA: Diagnosis not present

## 2016-02-07 ENCOUNTER — Encounter: Payer: Self-pay | Admitting: Gastroenterology

## 2016-02-18 DIAGNOSIS — Z7689 Persons encountering health services in other specified circumstances: Secondary | ICD-10-CM | POA: Diagnosis not present

## 2016-02-18 DIAGNOSIS — M79606 Pain in leg, unspecified: Secondary | ICD-10-CM | POA: Diagnosis not present

## 2016-02-18 DIAGNOSIS — Z8739 Personal history of other diseases of the musculoskeletal system and connective tissue: Secondary | ICD-10-CM | POA: Diagnosis not present

## 2016-02-18 DIAGNOSIS — E7800 Pure hypercholesterolemia, unspecified: Secondary | ICD-10-CM | POA: Diagnosis not present

## 2016-02-18 DIAGNOSIS — R6889 Other general symptoms and signs: Secondary | ICD-10-CM | POA: Diagnosis not present

## 2016-02-18 DIAGNOSIS — Z951 Presence of aortocoronary bypass graft: Secondary | ICD-10-CM | POA: Diagnosis not present

## 2016-02-18 DIAGNOSIS — Z91038 Other insect allergy status: Secondary | ICD-10-CM | POA: Diagnosis not present

## 2016-02-18 DIAGNOSIS — N401 Enlarged prostate with lower urinary tract symptoms: Secondary | ICD-10-CM | POA: Diagnosis not present

## 2016-04-08 DIAGNOSIS — Z85828 Personal history of other malignant neoplasm of skin: Secondary | ICD-10-CM | POA: Diagnosis not present

## 2016-04-08 DIAGNOSIS — L821 Other seborrheic keratosis: Secondary | ICD-10-CM | POA: Diagnosis not present

## 2016-04-08 DIAGNOSIS — Z08 Encounter for follow-up examination after completed treatment for malignant neoplasm: Secondary | ICD-10-CM | POA: Diagnosis not present

## 2016-04-08 DIAGNOSIS — L820 Inflamed seborrheic keratosis: Secondary | ICD-10-CM | POA: Diagnosis not present

## 2016-04-08 DIAGNOSIS — D225 Melanocytic nevi of trunk: Secondary | ICD-10-CM | POA: Diagnosis not present

## 2016-04-08 DIAGNOSIS — L570 Actinic keratosis: Secondary | ICD-10-CM | POA: Diagnosis not present

## 2016-04-08 DIAGNOSIS — X32XXXA Exposure to sunlight, initial encounter: Secondary | ICD-10-CM | POA: Diagnosis not present

## 2016-06-04 DIAGNOSIS — I2510 Atherosclerotic heart disease of native coronary artery without angina pectoris: Secondary | ICD-10-CM | POA: Diagnosis not present

## 2016-06-04 DIAGNOSIS — I10 Essential (primary) hypertension: Secondary | ICD-10-CM | POA: Diagnosis not present

## 2016-06-04 DIAGNOSIS — Z9889 Other specified postprocedural states: Secondary | ICD-10-CM | POA: Diagnosis not present

## 2016-06-04 DIAGNOSIS — Z01810 Encounter for preprocedural cardiovascular examination: Secondary | ICD-10-CM | POA: Diagnosis not present

## 2016-06-04 DIAGNOSIS — E7800 Pure hypercholesterolemia, unspecified: Secondary | ICD-10-CM | POA: Diagnosis not present

## 2016-08-10 DIAGNOSIS — Z91038 Other insect allergy status: Secondary | ICD-10-CM | POA: Diagnosis not present

## 2016-08-10 DIAGNOSIS — N401 Enlarged prostate with lower urinary tract symptoms: Secondary | ICD-10-CM | POA: Diagnosis not present

## 2016-08-10 DIAGNOSIS — M79606 Pain in leg, unspecified: Secondary | ICD-10-CM | POA: Diagnosis not present

## 2016-08-10 DIAGNOSIS — R6889 Other general symptoms and signs: Secondary | ICD-10-CM | POA: Diagnosis not present

## 2016-08-10 DIAGNOSIS — Z7689 Persons encountering health services in other specified circumstances: Secondary | ICD-10-CM | POA: Diagnosis not present

## 2016-08-10 DIAGNOSIS — Z951 Presence of aortocoronary bypass graft: Secondary | ICD-10-CM | POA: Diagnosis not present

## 2016-08-10 DIAGNOSIS — E7800 Pure hypercholesterolemia, unspecified: Secondary | ICD-10-CM | POA: Diagnosis not present

## 2016-08-17 DIAGNOSIS — R739 Hyperglycemia, unspecified: Secondary | ICD-10-CM | POA: Diagnosis not present

## 2016-08-17 DIAGNOSIS — I2510 Atherosclerotic heart disease of native coronary artery without angina pectoris: Secondary | ICD-10-CM | POA: Diagnosis not present

## 2016-08-17 DIAGNOSIS — Z951 Presence of aortocoronary bypass graft: Secondary | ICD-10-CM | POA: Diagnosis not present

## 2016-08-17 DIAGNOSIS — I10 Essential (primary) hypertension: Secondary | ICD-10-CM | POA: Diagnosis not present

## 2016-08-17 DIAGNOSIS — E7800 Pure hypercholesterolemia, unspecified: Secondary | ICD-10-CM | POA: Diagnosis not present

## 2016-08-17 DIAGNOSIS — E538 Deficiency of other specified B group vitamins: Secondary | ICD-10-CM | POA: Diagnosis not present

## 2016-08-17 DIAGNOSIS — Z8739 Personal history of other diseases of the musculoskeletal system and connective tissue: Secondary | ICD-10-CM | POA: Diagnosis not present

## 2016-08-17 DIAGNOSIS — R222 Localized swelling, mass and lump, trunk: Secondary | ICD-10-CM | POA: Diagnosis not present

## 2016-08-31 DIAGNOSIS — L723 Sebaceous cyst: Secondary | ICD-10-CM | POA: Diagnosis not present

## 2016-09-06 DIAGNOSIS — L723 Sebaceous cyst: Secondary | ICD-10-CM | POA: Diagnosis not present

## 2016-10-19 DIAGNOSIS — H60501 Unspecified acute noninfective otitis externa, right ear: Secondary | ICD-10-CM | POA: Diagnosis not present

## 2016-12-06 DIAGNOSIS — I10 Essential (primary) hypertension: Secondary | ICD-10-CM | POA: Diagnosis not present

## 2016-12-06 DIAGNOSIS — Z9889 Other specified postprocedural states: Secondary | ICD-10-CM | POA: Diagnosis not present

## 2016-12-06 DIAGNOSIS — Z951 Presence of aortocoronary bypass graft: Secondary | ICD-10-CM | POA: Diagnosis not present

## 2016-12-06 DIAGNOSIS — I2510 Atherosclerotic heart disease of native coronary artery without angina pectoris: Secondary | ICD-10-CM | POA: Diagnosis not present

## 2016-12-06 DIAGNOSIS — E785 Hyperlipidemia, unspecified: Secondary | ICD-10-CM | POA: Diagnosis not present

## 2017-01-06 DIAGNOSIS — C44612 Basal cell carcinoma of skin of right upper limb, including shoulder: Secondary | ICD-10-CM | POA: Diagnosis not present

## 2017-01-06 DIAGNOSIS — Z85828 Personal history of other malignant neoplasm of skin: Secondary | ICD-10-CM | POA: Diagnosis not present

## 2017-01-06 DIAGNOSIS — X32XXXA Exposure to sunlight, initial encounter: Secondary | ICD-10-CM | POA: Diagnosis not present

## 2017-01-06 DIAGNOSIS — D485 Neoplasm of uncertain behavior of skin: Secondary | ICD-10-CM | POA: Diagnosis not present

## 2017-01-06 DIAGNOSIS — L570 Actinic keratosis: Secondary | ICD-10-CM | POA: Diagnosis not present

## 2017-01-06 DIAGNOSIS — D2272 Melanocytic nevi of left lower limb, including hip: Secondary | ICD-10-CM | POA: Diagnosis not present

## 2017-01-06 DIAGNOSIS — D2261 Melanocytic nevi of right upper limb, including shoulder: Secondary | ICD-10-CM | POA: Diagnosis not present

## 2017-01-06 DIAGNOSIS — D225 Melanocytic nevi of trunk: Secondary | ICD-10-CM | POA: Diagnosis not present

## 2017-02-17 DIAGNOSIS — Z8739 Personal history of other diseases of the musculoskeletal system and connective tissue: Secondary | ICD-10-CM | POA: Diagnosis not present

## 2017-02-17 DIAGNOSIS — E7800 Pure hypercholesterolemia, unspecified: Secondary | ICD-10-CM | POA: Diagnosis not present

## 2017-02-17 DIAGNOSIS — I10 Essential (primary) hypertension: Secondary | ICD-10-CM | POA: Diagnosis not present

## 2017-02-17 DIAGNOSIS — E786 Lipoprotein deficiency: Secondary | ICD-10-CM | POA: Diagnosis not present

## 2017-02-17 DIAGNOSIS — R739 Hyperglycemia, unspecified: Secondary | ICD-10-CM | POA: Diagnosis not present

## 2017-02-17 DIAGNOSIS — E538 Deficiency of other specified B group vitamins: Secondary | ICD-10-CM | POA: Diagnosis not present

## 2017-02-17 DIAGNOSIS — Z951 Presence of aortocoronary bypass graft: Secondary | ICD-10-CM | POA: Diagnosis not present

## 2017-02-17 DIAGNOSIS — I2510 Atherosclerotic heart disease of native coronary artery without angina pectoris: Secondary | ICD-10-CM | POA: Diagnosis not present

## 2017-02-17 DIAGNOSIS — R222 Localized swelling, mass and lump, trunk: Secondary | ICD-10-CM | POA: Diagnosis not present

## 2017-02-23 DIAGNOSIS — C44612 Basal cell carcinoma of skin of right upper limb, including shoulder: Secondary | ICD-10-CM | POA: Diagnosis not present

## 2017-02-23 DIAGNOSIS — L905 Scar conditions and fibrosis of skin: Secondary | ICD-10-CM | POA: Diagnosis not present

## 2017-02-23 DIAGNOSIS — C44519 Basal cell carcinoma of skin of other part of trunk: Secondary | ICD-10-CM | POA: Diagnosis not present

## 2017-02-24 DIAGNOSIS — N401 Enlarged prostate with lower urinary tract symptoms: Secondary | ICD-10-CM | POA: Diagnosis not present

## 2017-02-24 DIAGNOSIS — I2510 Atherosclerotic heart disease of native coronary artery without angina pectoris: Secondary | ICD-10-CM | POA: Diagnosis not present

## 2017-02-24 DIAGNOSIS — Z951 Presence of aortocoronary bypass graft: Secondary | ICD-10-CM | POA: Diagnosis not present

## 2017-02-24 DIAGNOSIS — R7301 Impaired fasting glucose: Secondary | ICD-10-CM | POA: Diagnosis not present

## 2017-02-24 DIAGNOSIS — I10 Essential (primary) hypertension: Secondary | ICD-10-CM | POA: Diagnosis not present

## 2017-02-24 DIAGNOSIS — Z0000 Encounter for general adult medical examination without abnormal findings: Secondary | ICD-10-CM | POA: Diagnosis not present

## 2017-02-24 DIAGNOSIS — E785 Hyperlipidemia, unspecified: Secondary | ICD-10-CM | POA: Diagnosis not present

## 2017-02-24 DIAGNOSIS — Z23 Encounter for immunization: Secondary | ICD-10-CM | POA: Diagnosis not present

## 2017-06-08 ENCOUNTER — Other Ambulatory Visit: Payer: Self-pay | Admitting: Cardiology

## 2017-06-08 DIAGNOSIS — I714 Abdominal aortic aneurysm, without rupture, unspecified: Secondary | ICD-10-CM

## 2017-06-09 ENCOUNTER — Other Ambulatory Visit: Payer: Self-pay | Admitting: Cardiology

## 2017-06-09 DIAGNOSIS — I714 Abdominal aortic aneurysm, without rupture, unspecified: Secondary | ICD-10-CM

## 2017-06-09 DIAGNOSIS — R9389 Abnormal findings on diagnostic imaging of other specified body structures: Secondary | ICD-10-CM

## 2017-06-10 ENCOUNTER — Ambulatory Visit
Admission: RE | Admit: 2017-06-10 | Discharge: 2017-06-10 | Disposition: A | Discharge status: Home / Self Care | Payer: Medicare Other | Source: Ambulatory Visit | Attending: Cardiology | Admitting: Cardiology

## 2017-06-10 DIAGNOSIS — I714 Abdominal aortic aneurysm, without rupture, unspecified: Secondary | ICD-10-CM

## 2017-06-10 DIAGNOSIS — R9389 Abnormal findings on diagnostic imaging of other specified body structures: Secondary | ICD-10-CM

## 2017-08-16 ENCOUNTER — Observation Stay
Admission: EM | Admit: 2017-08-16 | Discharge: 2017-08-17 | Disposition: A | Discharge status: Home / Self Care | Payer: Medicare Other | Attending: Internal Medicine | Admitting: Internal Medicine

## 2017-08-16 ENCOUNTER — Encounter: Payer: Self-pay | Admitting: *Deleted

## 2017-08-16 ENCOUNTER — Other Ambulatory Visit: Payer: Self-pay

## 2017-08-16 DIAGNOSIS — I252 Old myocardial infarction: Secondary | ICD-10-CM | POA: Diagnosis not present

## 2017-08-16 DIAGNOSIS — G44211 Episodic tension-type headache, intractable: Secondary | ICD-10-CM | POA: Diagnosis present

## 2017-08-16 DIAGNOSIS — M109 Gout, unspecified: Secondary | ICD-10-CM | POA: Diagnosis not present

## 2017-08-16 DIAGNOSIS — Z79899 Other long term (current) drug therapy: Secondary | ICD-10-CM | POA: Diagnosis not present

## 2017-08-16 DIAGNOSIS — I2510 Atherosclerotic heart disease of native coronary artery without angina pectoris: Secondary | ICD-10-CM | POA: Insufficient documentation

## 2017-08-16 DIAGNOSIS — E785 Hyperlipidemia, unspecified: Secondary | ICD-10-CM | POA: Diagnosis not present

## 2017-08-16 DIAGNOSIS — R739 Hyperglycemia, unspecified: Secondary | ICD-10-CM | POA: Insufficient documentation

## 2017-08-16 DIAGNOSIS — Z880 Allergy status to penicillin: Secondary | ICD-10-CM | POA: Diagnosis not present

## 2017-08-16 DIAGNOSIS — Z7982 Long term (current) use of aspirin: Secondary | ICD-10-CM | POA: Insufficient documentation

## 2017-08-16 DIAGNOSIS — I10 Essential (primary) hypertension: Secondary | ICD-10-CM | POA: Insufficient documentation

## 2017-08-16 DIAGNOSIS — D72829 Elevated white blood cell count, unspecified: Secondary | ICD-10-CM | POA: Insufficient documentation

## 2017-08-16 DIAGNOSIS — M542 Cervicalgia: Secondary | ICD-10-CM | POA: Diagnosis present

## 2017-08-16 DIAGNOSIS — G4453 Primary thunderclap headache: Secondary | ICD-10-CM | POA: Diagnosis not present

## 2017-08-16 DIAGNOSIS — G4700 Insomnia, unspecified: Secondary | ICD-10-CM | POA: Diagnosis not present

## 2017-08-16 DIAGNOSIS — N400 Enlarged prostate without lower urinary tract symptoms: Secondary | ICD-10-CM | POA: Insufficient documentation

## 2017-08-16 DIAGNOSIS — I700 Atherosclerosis of aorta: Secondary | ICD-10-CM | POA: Insufficient documentation

## 2017-08-16 DIAGNOSIS — Z87891 Personal history of nicotine dependence: Secondary | ICD-10-CM | POA: Diagnosis not present

## 2017-08-16 HISTORY — DX: Acute myocardial infarction, unspecified: I21.9

## 2017-08-16 MED ORDER — FENTANYL CITRATE (PF) 100 MCG/2ML IJ SOLN
100.0000 ug | Freq: Once | INTRAMUSCULAR | Status: AC
  Administered 2017-08-16: 100 ug via INTRAVENOUS
  Filled 2017-08-16: qty 2

## 2017-08-16 NOTE — ED Provider Notes (Signed)
[facility] Emergency Department Provider Note  ____________________________________________   First MD Initiated Contact with Patient 08/16/17 2334     (approximate)  I have reviewed the triage vital signs and the nursing notes.   HISTORY  Chief Complaint Neck Pain    HPI Harry Pacheco. is a 72 y.o. male who self presents to the emergency department with severe left neck pain radiating down his left arm and up his left side of his head.  The pain came on while he was driving and when he looked left suddenly.  He denies double vision or blurred vision.  The pain is severe and unlike any pain he is ever had before.  It is worse with movement.  He denies chest pain or shortness of breath.  He denies abdominal pain nausea or vomiting.  Past Medical History:  Diagnosis Date  . Chronic back pain   . Hyperlipemia   . Hypertension   . MI (myocardial infarction) [facility])     Patient Active Problem List   Diagnosis Date Noted  . Medicare annual wellness visit, initial 03/23/2013  . Essential hypertension, benign 09/01/2012  . Gout flare 09/16/2011  . Insomnia 06/23/2011  . Hyperlipidemia 06/23/2011  . Muscle spasm of back 06/23/2011    Past Surgical History:  Procedure Laterality Date  . BICEPS TENDON REPAIR    . COLONOSCOPY WITH PROPOFOL N/A 02/03/2016   Procedure: COLONOSCOPY WITH PROPOFOL;  Surgeon: Lollie Sails, MD;  Location: [facility] ENDOSCOPY;  Service: Endoscopy;  Laterality: N/A;  . CORONARY ARTERY BYPASS GRAFT    . LUMBAR SPINE SURGERY    . SHOULDER SURGERY      Prior to Admission medications   Medication Sig Start Date End Date Taking? Authorizing Provider  amitriptyline (ELAVIL) 50 MG tablet TAKE 1 TABLET BY MOUTH AT BEDTIME. PLEASE FILL ON 04/09/2013 10/25/13   Jackolyn Confer, MD  aspirin 325 MG tablet Take 325 mg by mouth daily.    [provider]  atenolol (TENORMIN) 100 MG tablet Take 100 mg by mouth daily.     [provider]  colchicine 0.6 MG tablet Take 1.2mg  (2 tablets), then may take 0.6mg  1 hr later if symptoms not improved. Then, use 0.6mg  daily if flare continues. 09/01/12   Jackolyn Confer, MD  EPIPEN 2-PAK 0.3 MG/0.3ML SOAJ injection INJECT  INTO THE MUSCLE ONCE PRF ANAPHYLAXIS 11/21/14   [provider]  indomethacin (INDOCIN) 50 MG capsule Take 1 capsule (50 mg total) by mouth 3 (three) times daily with meals. 09/01/12   Jackolyn Confer, MD  losartan (COZAAR) 50 MG tablet Take 1 tablet (50 mg total) by mouth daily. 12/30/14   Jackolyn Confer, MD  simvastatin (ZOCOR) 20 MG tablet Take 1 tablet (20 mg total) by mouth every evening. 12/30/14   Jackolyn Confer, MD  tamsulosin (FLOMAX) 0.4 MG CAPS capsule Take 0.4 mg by mouth. 10/31/14   [provider]    Allergies Bee venom and Penicillins  Family History  Problem Relation Age of Onset  . Cancer Sister        Sinus, Lung - dx 24's, deceased at 29  . Cancer Brother        Testicular - Dx and deceased in 21's  . Arthritis Mother   . Alcohol abuse Father   . Cancer Father        Prostate - dx in 23's  . Heart disease Maternal Grandmother   . Hypertension Maternal  Grandmother   . Heart disease Maternal Grandfather   . Hypertension Maternal Grandfather   . Heart disease Paternal Grandmother   . Hypertension Paternal Grandmother   . Early death Paternal Grandfather 31       MI   . Heart disease Paternal Grandfather   . Hypertension Paternal Grandfather     Social History Social History   Tobacco Use  . Smoking status: Former Smoker    Last attempt to quit: 06/22/2008    Years since quitting: 9.1  . Smokeless tobacco: Current User  Substance Use Topics  . Alcohol use: No  . Drug use: No    Review of Systems Constitutional: No fever/chills Eyes: No visual changes. ENT: No sore throat. Cardiovascular: Denies chest pain. Respiratory: Denies shortness of breath. Gastrointestinal: No abdominal  pain.  No nausea, no vomiting.  No diarrhea.  No constipation. Genitourinary: Negative for dysuria. Musculoskeletal: Negative for back pain. Skin: Negative for rash. Neurological: Negative for headaches, focal weakness or numbness.   ____________________________________________   PHYSICAL EXAM:  VITAL SIGNS: ED Triage Vitals  Enc Vitals Group     BP 08/16/17 2326 (!) 175/122     Pulse Rate 08/16/17 2326 85     Resp 08/16/17 2326 18     Temp 08/16/17 2326 98.2 F (36.8 C)     Temp Source 08/16/17 2326 Oral     SpO2 08/16/17 2326 96 %     Weight --      Height --      Head Circumference --      Peak Flow --      Pain Score 08/16/17 2325 5     Pain Loc --      Pain Edu? --      Excl. in [place]? --     Constitutional: Alert and oriented x4 appears miserable sitting completely still in bed unable to turn his head to the left whatsoever Eyes: PERRL EOMI. midrange and brisk Head: Atraumatic. Nose: No congestion/rhinnorhea. Mouth/Throat: No trismus Neck: No stridor.  Exquisitely tender to the lateral aspect of the left neck Cardiovascular: Normal rate, regular rhythm. Grossly normal heart sounds.  Good peripheral circulation. Respiratory: Normal respiratory effort.  No retractions. Lungs CTAB and moving good air Gastrointestinal: Soft nontender Musculoskeletal: No lower extremity edema   Neurologic:  Normal speech and language. No gross focal neurologic deficits are appreciated. Skin:  Skin is warm, dry and intact. No rash noted. Psychiatric: Mood and affect are normal. Speech and behavior are normal.    ____________________________________________   DIFFERENTIAL includes but not limited to  Aortic dissection, cervical dissection, subarachnoid hemorrhage, intracerebral hemorrhage, radicular pain ____________________________________________   LABS (all labs ordered are listed, but only abnormal results are displayed)  Labs Reviewed  COMPREHENSIVE METABOLIC PANEL -  Abnormal; Notable for the following components:      Result Value   Glucose, Bld 159 (*)    BUN 35 (*)    Total Bilirubin 0.1 (*)    GFR calc non Af Amer 59 (*)    All other components within normal limits  CBC WITH DIFFERENTIAL/PLATELET - Abnormal; Notable for the following components:   WBC 13.5 (*)    Neutro Abs 9.1 (*)    Basophils Absolute 0.2 (*)    All other components within normal limits  TROPONIN I    Lab work reviewed by me with elevated white count which is nonspecific and could be secondary to pain __________________________________________  EKG   ____________________________________________  RADIOLOGY  CT angiograms reviewed by me with no acute disease ____________________________________________   PROCEDURES  Procedure(s) performed: no  Procedures  Critical Care performed: no  Observation: no ____________________________________________   INITIAL IMPRESSION / ASSESSMENT AND PLAN / ED COURSE  Pertinent labs & imaging results that were available during my care of the patient were reviewed by me and considered in my medical decision making (see chart for details).  On arrival the patient has severe left neck pain radiating up his neck and down his arm raising concern for dissection.  Sent to the scanner without creatinine given high clinical suspicion.    ----------------------------------------- 3:42 AM on 08/17/2017 -----------------------------------------  The patient has now received 200 mcg of fentanyl, 15 mg of Toradol, 10 mg of Compazine, 50 mg of Benadryl, and 2 g of magnesium and his pain is essentially unchanged.  While his CT angiogram is reassuring I do not have a clear etiology of his neck pain and headache.  I do not believe he has meningitis or encephalitis and his neuro exam is normal.  At this point given the diagnostic uncertainty and high pain requirements the patient will be admitted to the hospital for continued intravenous pain  medication and likely neurology consultation in the morning.  ____________________________________________   FINAL CLINICAL IMPRESSION(S) / ED DIAGNOSES  Final diagnoses:  Neck pain  Thunderclap headache      NEW MEDICATIONS STARTED DURING THIS VISIT:  New Prescriptions   No medications on file     Note:  This document was prepared using Dragon voice recognition software and may include unintentional dictation errors.     Darel Hong, MD 08/17/17 [phone]

## 2017-08-16 NOTE — ED Triage Notes (Signed)
Pt reports sharp pain on the left side of his neck that goes down through his left shoulder and upper left arm and also up behind his left ear/head.  Onset of pain around 3pm while he was driving. He said he went to turn his neck to the left and he couldn't because of the pain.He took 2 tylenol tonight for the pain. Pt does report hx of HTN.

## 2017-08-17 ENCOUNTER — Observation Stay: Payer: Medicare Other

## 2017-08-17 ENCOUNTER — Emergency Department: Payer: Medicare Other

## 2017-08-17 ENCOUNTER — Other Ambulatory Visit: Payer: Self-pay

## 2017-08-17 DIAGNOSIS — G4453 Primary thunderclap headache: Secondary | ICD-10-CM | POA: Diagnosis not present

## 2017-08-17 DIAGNOSIS — G44211 Episodic tension-type headache, intractable: Secondary | ICD-10-CM | POA: Diagnosis present

## 2017-08-17 LAB — COMPREHENSIVE METABOLIC PANEL
ALBUMIN: 4.6 g/dL (ref 3.5–5.0)
ALT: 24 U/L (ref 17–63)
ANION GAP: 8 (ref 5–15)
AST: 27 U/L (ref 15–41)
Alkaline Phosphatase: 76 U/L (ref 38–126)
BILIRUBIN TOTAL: 0.1 mg/dL — AB (ref 0.3–1.2)
BUN: 35 mg/dL — ABNORMAL HIGH (ref 6–20)
CO2: 27 mmol/L (ref 22–32)
Calcium: 8.9 mg/dL (ref 8.9–10.3)
Chloride: 103 mmol/L (ref 101–111)
Creatinine, Ser: 1.22 mg/dL (ref 0.61–1.24)
GFR calc Af Amer: >60 mL/min (ref >60)
GFR calc non Af Amer: 59 mL/min — ABNORMAL LOW (ref >60)
GLUCOSE: 159 mg/dL — AB (ref 65–99)
POTASSIUM: 4.3 mmol/L (ref 3.5–5.1)
Sodium: 138 mmol/L (ref 135–145)
TOTAL PROTEIN: 7.4 g/dL (ref 6.5–8.1)

## 2017-08-17 LAB — CBC
HEMATOCRIT: 41.7 % (ref 40.0–52.0)
Hemoglobin: 14.3 g/dL (ref 13.0–18.0)
MCH: 31 pg (ref 26.0–34.0)
MCHC: 34.3 g/dL (ref 32.0–36.0)
MCV: 90.3 fL (ref 80.0–100.0)
Platelets: 215 10*3/uL (ref 150–440)
RBC: 4.62 MIL/uL (ref 4.40–5.90)
RDW: 13.1 % (ref 11.5–14.5)
WBC: 10.4 10*3/uL (ref 3.8–10.6)

## 2017-08-17 LAB — CBC WITH DIFFERENTIAL/PLATELET
BASOS ABS: 0.2 10*3/uL — AB (ref 0–0.1)
BASOS PCT: 2 %
EOS ABS: 0.4 10*3/uL (ref 0–0.7)
EOS PCT: 3 %
HCT: 43.5 % (ref 40.0–52.0)
HEMOGLOBIN: 14.7 g/dL (ref 13.0–18.0)
LYMPHS ABS: 3 10*3/uL (ref 1.0–3.6)
Lymphocytes Relative: 22 %
MCH: 30.5 pg (ref 26.0–34.0)
MCHC: 33.7 g/dL (ref 32.0–36.0)
MCV: 90.6 fL (ref 80.0–100.0)
Monocytes Absolute: 0.8 10*3/uL (ref 0.2–1.0)
Monocytes Relative: 6 %
NEUTROS PCT: 67 %
Neutro Abs: 9.1 10*3/uL — ABNORMAL HIGH (ref 1.4–6.5)
PLATELETS: 244 10*3/uL (ref 150–440)
RBC: 4.8 MIL/uL (ref 4.40–5.90)
RDW: 13.5 % (ref 11.5–14.5)
WBC: 13.5 10*3/uL — AB (ref 3.8–10.6)

## 2017-08-17 LAB — BASIC METABOLIC PANEL
Anion gap: 5 (ref 5–15)
BUN: 32 mg/dL — AB (ref 6–20)
CALCIUM: 8.7 mg/dL — AB (ref 8.9–10.3)
CO2: 27 mmol/L (ref 22–32)
CREATININE: 1.14 mg/dL (ref 0.61–1.24)
Chloride: 105 mmol/L (ref 101–111)
GFR calc non Af Amer: >60 mL/min (ref >60)
Glucose, Bld: 138 mg/dL — ABNORMAL HIGH (ref 65–99)
Potassium: 4 mmol/L (ref 3.5–5.1)
Sodium: 137 mmol/L (ref 135–145)

## 2017-08-17 LAB — TROPONIN I

## 2017-08-17 LAB — HEMOGLOBIN A1C
Hgb A1c MFr Bld: 6.7 % — ABNORMAL HIGH (ref 4.8–5.6)
MEAN PLASMA GLUCOSE: 145.59 mg/dL

## 2017-08-17 MED ORDER — ATENOLOL 100 MG PO TABS
100.0000 mg | ORAL_TABLET | Freq: Every day | ORAL | Status: DC
  Administered 2017-08-17: 100 mg via ORAL
  Filled 2017-08-17: qty 2

## 2017-08-17 MED ORDER — SENNOSIDES-DOCUSATE SODIUM 8.6-50 MG PO TABS: 1.0000 | ORAL_TABLET | Freq: Every evening | ORAL | Status: DC | PRN

## 2017-08-17 MED ORDER — SIMVASTATIN 20 MG PO TABS: 20.0000 mg | ORAL_TABLET | Freq: Every evening | ORAL | Status: DC

## 2017-08-17 MED ORDER — LACTATED RINGERS IV SOLN
INTRAVENOUS | Status: DC
  Administered 2017-08-17: 04:00:00 via INTRAVENOUS

## 2017-08-17 MED ORDER — ONDANSETRON HCL 4 MG/2ML IJ SOLN: 4.0000 mg | Freq: Four times a day (QID) | INTRAMUSCULAR | Status: DC | PRN

## 2017-08-17 MED ORDER — MAGNESIUM SULFATE 2 GM/50ML IV SOLN
2.0000 g | Freq: Once | INTRAVENOUS | Status: AC
  Administered 2017-08-17: 2 g via INTRAVENOUS
  Filled 2017-08-17: qty 50

## 2017-08-17 MED ORDER — ENOXAPARIN SODIUM 40 MG/0.4ML ~~LOC~~ SOLN: 40.0000 mg | SUBCUTANEOUS | Status: DC

## 2017-08-17 MED ORDER — SUMATRIPTAN SUCCINATE 100 MG PO TABS
100.0000 mg | ORAL_TABLET | Freq: Once | ORAL | Status: AC
  Administered 2017-08-17: 100 mg via ORAL
  Filled 2017-08-17: qty 1

## 2017-08-17 MED ORDER — BUTALBITAL-APAP-CAFFEINE 50-325-40 MG PO TABS
2.0000 | ORAL_TABLET | Freq: Once | ORAL | Status: AC
  Administered 2017-08-17: 2 via ORAL
  Filled 2017-08-17: qty 2

## 2017-08-17 MED ORDER — BUTALBITAL-APAP-CAFFEINE 50-325-40 MG PO TABS: 1.0000 | ORAL_TABLET | Freq: Four times a day (QID) | ORAL | 0 refills | Status: AC | PRN

## 2017-08-17 MED ORDER — ACETAMINOPHEN 325 MG PO TABS: 650.0000 mg | ORAL_TABLET | Freq: Four times a day (QID) | ORAL | Status: DC | PRN

## 2017-08-17 MED ORDER — BISACODYL 5 MG PO TBEC: 5.0000 mg | DELAYED_RELEASE_TABLET | Freq: Every day | ORAL | Status: DC | PRN

## 2017-08-17 MED ORDER — IOPAMIDOL (ISOVUE-370) INJECTION 76%
125.0000 mL | Freq: Once | INTRAVENOUS | Status: AC | PRN
  Administered 2017-08-17: 125 mL via INTRAVENOUS

## 2017-08-17 MED ORDER — HYDROMORPHONE HCL 1 MG/ML IJ SOLN: 0.5000 mg | INTRAMUSCULAR | Status: DC | PRN

## 2017-08-17 MED ORDER — DIAZEPAM 5 MG PO TABS: 5.0000 mg | ORAL_TABLET | Freq: Once | ORAL | Status: DC

## 2017-08-17 MED ORDER — COLCHICINE 0.6 MG PO TABS
0.6000 mg | ORAL_TABLET | Freq: Every day | ORAL | Status: DC
  Filled 2017-08-17: qty 1

## 2017-08-17 MED ORDER — ASPIRIN 325 MG PO TABS
325.0000 mg | ORAL_TABLET | Freq: Every day | ORAL | Status: DC
  Administered 2017-08-17: 09:00:00 325 mg via ORAL
  Filled 2017-08-17: qty 1

## 2017-08-17 MED ORDER — TAMSULOSIN HCL 0.4 MG PO CAPS
0.4000 mg | ORAL_CAPSULE | Freq: Every day | ORAL | Status: DC
  Administered 2017-08-17: 09:00:00 0.4 mg via ORAL
  Filled 2017-08-17: qty 1

## 2017-08-17 MED ORDER — KETOROLAC TROMETHAMINE 30 MG/ML IJ SOLN
15.0000 mg | Freq: Once | INTRAMUSCULAR | Status: AC
  Administered 2017-08-17: 15 mg via INTRAVENOUS
  Filled 2017-08-17: qty 1

## 2017-08-17 MED ORDER — FENTANYL CITRATE (PF) 100 MCG/2ML IJ SOLN
100.0000 ug | Freq: Once | INTRAMUSCULAR | Status: DC
  Filled 2017-08-17: qty 2

## 2017-08-17 MED ORDER — ACETAMINOPHEN 650 MG RE SUPP: 650.0000 mg | Freq: Four times a day (QID) | RECTAL | Status: DC | PRN

## 2017-08-17 MED ORDER — BUTALBITAL-APAP-CAFFEINE 50-325-40 MG PO TABS
ORAL_TABLET | ORAL | Status: AC
  Filled 2017-08-17: qty 1

## 2017-08-17 MED ORDER — DEXAMETHASONE SODIUM PHOSPHATE 10 MG/ML IJ SOLN
10.0000 mg | Freq: Once | INTRAMUSCULAR | Status: AC
  Administered 2017-08-17: 10 mg via INTRAVENOUS
  Filled 2017-08-17: qty 1

## 2017-08-17 MED ORDER — ONDANSETRON HCL 4 MG PO TABS: 4.0000 mg | ORAL_TABLET | Freq: Four times a day (QID) | ORAL | Status: DC | PRN

## 2017-08-17 MED ORDER — HYDROMORPHONE HCL 1 MG/ML IJ SOLN: 1.0000 mg | INTRAMUSCULAR | Status: DC | PRN

## 2017-08-17 MED ORDER — DIPHENHYDRAMINE HCL 50 MG/ML IJ SOLN
50.0000 mg | Freq: Once | INTRAMUSCULAR | Status: AC
  Administered 2017-08-17: 50 mg via INTRAVENOUS
  Filled 2017-08-17: qty 1

## 2017-08-17 MED ORDER — AMITRIPTYLINE HCL 25 MG PO TABS: 50.0000 mg | ORAL_TABLET | Freq: Every day | ORAL | Status: DC

## 2017-08-17 MED ORDER — PROCHLORPERAZINE EDISYLATE 10 MG/2ML IJ SOLN
10.0000 mg | Freq: Once | INTRAMUSCULAR | Status: AC
  Administered 2017-08-17: 10 mg via INTRAVENOUS
  Filled 2017-08-17: qty 2

## 2017-08-17 MED ORDER — LOSARTAN POTASSIUM 50 MG PO TABS
50.0000 mg | ORAL_TABLET | Freq: Every day | ORAL | Status: DC
  Administered 2017-08-17: 09:00:00 50 mg via ORAL
  Filled 2017-08-17: qty 1

## 2017-08-17 MED ORDER — DIAZEPAM 5 MG PO TABS: 5.0000 mg | ORAL_TABLET | Freq: Four times a day (QID) | ORAL | Status: DC | PRN

## 2017-08-17 NOTE — ED Notes (Signed)
Patient became very diaphoretic after receiving Fentanyl. Unsure if Fentanyl was cause or if patient has had Fentanyl in the past. Dr. Mable Paris notified. Patient to go to CT scan without waiting on lab results. CT department notified.

## 2017-08-17 NOTE — ED Notes (Signed)
Patient chose to wait on taking Dilaudid d/t taking new medications just prior to medication being ordered. Patient aware that Dilaudid is available if needed.

## 2017-08-17 NOTE — H&P (Signed)
[facility] [place] NAME: Harry Pacheco    MR#:  [id number]  DATE OF BIRTH:  Jan 11, 1946  DATE OF ADMISSION:  08/16/2017  PRIMARY CARE PHYSICIAN: Tracie Harrier, MD   REQUESTING/REFERRING PHYSICIAN: Darel Hong, MD  CHIEF COMPLAINT:   Chief Complaint  Patient presents with  . Neck Pain    HISTORY OF PRESENT ILLNESS:  Harry Pacheco  is a 72 y.o. male with a known history of HTN, HLD, CAD/MI (s/p PTCA x2, 1994; CABG x3, 2002), AAA (2.8 x 2.7, 06/2013), BPH, gout, nephrolithiasis, insomnia, DJD/OA p/w 1d Hx L neck pain + HA. Pt states he has not had this issue in the past. He states that @~1600PM on Tuesday (08/16/2017), he was driving his truck, and stopped in order to look left and check for traffic. When he tried to turn his head left, he was unable to do so. He developed a sudden-onset sharp severe pain along the left side of his neck, from the base of the L neck radiating up behind the L ear and up the L posterior neck, into the posterior scalp/L occiput and over the L temporo-parietal scalp. He denies pain over the face and forehead. He denies frontal headache or pain behind the eyes. He denies photophobia/phonophobia, blurred vision, acute changes in hearing/vision, N/V, vertigo, imbalance/dysequilibrium, sensory/motor deficits, unilateral weakness, LH or LOC. He denies obvious trauma or injury. He states that he managed to drive himself home.  At home, pt states the pain was constant, but would intermittently flare up, and continued to get progressively worse until ~2100-2130PM. Pt states he tried to go to sleep, but was unable to do so. He states the neck pain and headache/scalp pain are positional (worsened by any head and neck movements, but especially w/ turning neck L or R, L > R) and reproducible w/ palpation and active/passive range of motion of the neck and head. The pain is not affected by arm/shoulder movements. At the pain  continued, pt also endorsed neck stiffness [however, (-) Kernig, (-) Brudzinski, (-) nuchal rigidity]. He states he got out of bed and discussed his worsening symptoms with his wife. They agreed that the pt should come to the hospital, and his wife subsequently drove him to the ED for evaluation.  At the time of my Hx/examination, pt denies F/C/N/V/D/AP, CP, SOB, palpitations, diaphoresis, rigors, night sweats, weight loss, cough, hemoptysis, wheezing, urinary symptoms. He is well-appearing and in no acute distress. He endorses a Hx of L rotator cuff repair surgery. He states he has been on Flonase and Azithromycin (Z-Pak) since Sunday, for treatment of sinusitis (which has since improved). He is otherwise w/o complaint.  PAST MEDICAL HISTORY:   Past Medical History:  Diagnosis Date  . Chronic back pain   . Hyperlipemia   . Hypertension   . MI (myocardial infarction) ([place])     PAST SURGICAL HISTORY:   Past Surgical History:  Procedure Laterality Date  . BICEPS TENDON REPAIR    . COLONOSCOPY WITH PROPOFOL N/A 02/03/2016   Procedure: COLONOSCOPY WITH PROPOFOL;  Surgeon: Lollie Sails, MD;  Location: [facility] ENDOSCOPY;  Service: Endoscopy;  Laterality: N/A;  . CORONARY ARTERY BYPASS GRAFT    . LUMBAR SPINE SURGERY    . SHOULDER SURGERY      SOCIAL HISTORY:   Social History   Tobacco Use  . Smoking status: Former Smoker    Last attempt to quit: 06/22/2008    Years since quitting: 9.1  .  Smokeless tobacco: Current User  Substance Use Topics  . Alcohol use: No    FAMILY HISTORY:   Family History  Problem Relation Age of Onset  . Cancer Sister        Sinus, Lung - dx 56's, deceased at 52  . Cancer Brother        Testicular - Dx and deceased in 54's  . Arthritis Mother   . Alcohol abuse Father   . Cancer Father        Prostate - dx in 38's  . Heart disease Maternal Grandmother   . Hypertension Maternal Grandmother   . Heart disease Maternal Grandfather   . Hypertension  Maternal Grandfather   . Heart disease Paternal Grandmother   . Hypertension Paternal Grandmother   . Early death Paternal Grandfather 73       MI   . Heart disease Paternal Grandfather   . Hypertension Paternal Grandfather     DRUG ALLERGIES:   Allergies  Allergen Reactions  . Bee Venom Anaphylaxis  . Penicillins Rash    REVIEW OF SYSTEMS:   Review of Systems  Constitutional: Negative for chills, diaphoresis, fever, malaise/fatigue and weight loss.  HENT: Negative for congestion, ear pain, hearing loss, nosebleeds, sinus pain, sore throat and tinnitus.   Eyes: Negative for blurred vision, double vision and photophobia.  Respiratory: Negative for cough, hemoptysis, sputum production, shortness of breath and wheezing.   Cardiovascular: Negative for chest pain, palpitations, orthopnea, claudication, leg swelling and PND.  Gastrointestinal: Negative for abdominal pain, blood in stool, constipation, diarrhea, heartburn, melena, nausea and vomiting.  Genitourinary: Negative for dysuria, frequency, hematuria and urgency.  Musculoskeletal: Positive for neck pain. Negative for back pain, falls, joint pain and myalgias.  Skin: Negative for itching and rash.  Neurological: Positive for headaches. Negative for dizziness, tingling, tremors, sensory change, speech change, focal weakness, seizures, loss of consciousness and weakness.    MEDICATIONS AT HOME:   Prior to Admission medications   Medication Sig Start Date End Date Taking? Authorizing Provider  amitriptyline (ELAVIL) 50 MG tablet TAKE 1 TABLET BY MOUTH AT BEDTIME. PLEASE FILL ON 04/09/2013 10/25/13   Jackolyn Confer, MD  aspirin 325 MG tablet Take 325 mg by mouth daily.    [provider]  atenolol (TENORMIN) 100 MG tablet Take 100 mg by mouth daily.    [provider]  colchicine 0.6 MG tablet Take 1.2mg  (2 tablets), then may take 0.6mg  1 hr later if symptoms not improved. Then, use 0.6mg  daily if flare  continues. 09/01/12   Jackolyn Confer, MD  EPIPEN 2-PAK 0.3 MG/0.3ML SOAJ injection INJECT  INTO THE MUSCLE ONCE PRF ANAPHYLAXIS 11/21/14   [provider]  indomethacin (INDOCIN) 50 MG capsule Take 1 capsule (50 mg total) by mouth 3 (three) times daily with meals. 09/01/12   Jackolyn Confer, MD  losartan (COZAAR) 50 MG tablet Take 1 tablet (50 mg total) by mouth daily. 12/30/14   Jackolyn Confer, MD  simvastatin (ZOCOR) 20 MG tablet Take 1 tablet (20 mg total) by mouth every evening. 12/30/14   Jackolyn Confer, MD  tamsulosin (FLOMAX) 0.4 MG CAPS capsule Take 0.4 mg by mouth. 10/31/14   [provider]      VITAL SIGNS:  Blood pressure 131/61, pulse (!) 58, temperature 98.2 F (36.8 C), temperature source Oral, resp. rate 18, SpO2 93 %.  PHYSICAL EXAMINATION:  Physical Exam  Constitutional: He is oriented to person, place, and time. He appears well-developed  and well-nourished. He is active and cooperative.  Non-toxic appearance. He does not have a sickly appearance. He does not appear ill. No distress.  HENT:  Head: Normocephalic and atraumatic.  Mouth/Throat: No oropharyngeal exudate.  Eyes: Conjunctivae, EOM and lids are normal. No scleral icterus.  Neck: Neck supple. No JVD present. Muscular tenderness present. No neck rigidity. Decreased range of motion present. No tracheal deviation, no edema and no erythema present. No Brudzinski's sign and no Kernig's sign noted. No thyroid mass and no thyromegaly present.  (+) limited neck AROM + PROM, pain w/ AROM + PROM (turning neck causes worse pain than nodding head). (+) TTP L neck, L occiput, L temporo-parietal scalp. (+) pain w/ movement/position change.  Cardiovascular: Normal rate, regular rhythm, S1 normal, S2 normal and normal heart sounds.  No extrasystoles are present. Exam reveals no gallop, no S3, no S4, no distant heart sounds and no friction rub.  No murmur heard. Pulmonary/Chest: Effort normal and breath  sounds normal. No accessory muscle usage or stridor. No apnea, no tachypnea and no bradypnea. No respiratory distress. He has no decreased breath sounds. He has no wheezes. He has no rhonchi. He has no rales.  Abdominal: Soft. Bowel sounds are normal. He exhibits no distension. There is no tenderness. There is no rebound and no guarding.  Musculoskeletal: He exhibits no edema.  Neurological: He is alert and oriented to person, place, and time. He has normal strength and normal reflexes. He is not disoriented. He displays normal reflexes. No cranial nerve deficit or sensory deficit. He exhibits normal muscle tone. Coordination normal.  (+) cranial nerve exam (shrugging, turning head) slightly limited due to neck pain, but CN intact. Sensory, motor, cerebellar, reflex exams WNL. Non-focal.  Skin: Skin is warm, dry and intact. No rash noted. He is not diaphoretic. No erythema.  Psychiatric: He has a normal mood and affect. His speech is normal and behavior is normal. Judgment and thought content normal. Cognition and memory are normal.   LABORATORY PANEL:   CBC Recent Labs  Lab 08/16/17 2348  WBC 13.5*  HGB 14.7  HCT 43.5  PLT 244   ------------------------------------------------------------------------------------------------------------------  Chemistries  Recent Labs  Lab 08/16/17 2348  NA 138  K 4.3  CL 103  CO2 27  GLUCOSE 159*  BUN 35*  CREATININE 1.22  CALCIUM 8.9  AST 27  ALT 24  ALKPHOS 76  BILITOT 0.1*   ------------------------------------------------------------------------------------------------------------------  Cardiac Enzymes Recent Labs  Lab 08/16/17 2348  TROPONINI <0.03   ------------------------------------------------------------------------------------------------------------------  RADIOLOGY:  Ct Angio Head W Or Wo Contrast  Result Date: 08/17/2017 CLINICAL DATA:  Sharp left-sided neck pain. EXAM: CT ANGIOGRAPHY HEAD AND NECK TECHNIQUE:  Multidetector CT imaging of the head and neck was performed using the standard protocol during bolus administration of intravenous contrast. Multiplanar CT image reconstructions and MIPs were obtained to evaluate the vascular anatomy. Carotid stenosis measurements (when applicable) are obtained utilizing NASCET criteria, using the distal internal carotid diameter as the denominator. CONTRAST:  132mL ISOVUE-370 IOPAMIDOL (ISOVUE-370) INJECTION 76% COMPARISON:  Head CT 06/06/2008 FINDINGS: CT HEAD FINDINGS BRAIN: No mass lesion, intraparenchymal hemorrhage or extra-axial collection. No evidence of acute cortical infarct. Normal appearance of the brain parenchyma and extra axial spaces for age. VASCULAR: Atherosclerotic calcification of the vertebral and internal carotid arteries at the skull base. No hyperdense vessel. SKULL: Normal visualized skull base, calvarium and extracranial soft tissues. SINUSES/ORBITS: Moderate bilateral maxillary, left frontal and left ethmoid mucosal thickening. Fluid level in left maxillary  sinus. Normal orbits. CTA NECK FINDINGS AORTIC ARCH: There is moderate calcific atherosclerosis of the aortic arch. There is no aneurysm, dissection or hemodynamically significant stenosis of the visualized ascending aorta and aortic arch. Conventional 3 vessel aortic branching pattern. The visualized proximal subclavian arteries are widely patent. RIGHT CAROTID SYSTEM: --Common carotid artery: Widely patent origin without common carotid artery dissection or aneurysm. --Internal carotid artery: No dissection, occlusion or aneurysm. Mild atherosclerotic calcification at the carotid bifurcation without hemodynamically significant stenosis. --External carotid artery: No acute abnormality. LEFT CAROTID SYSTEM: --Common carotid artery: Widely patent origin without common carotid artery dissection or aneurysm. --Internal carotid artery:No dissection, occlusion or aneurysm. No hemodynamically significant  stenosis. --External carotid artery: No acute abnormality. VERTEBRAL ARTERIES: Right dominant configuration. Both origins are normal. Moderate narrowing of the left V4 segment secondary to atherosclerotic calcification. SKELETON: There is no bony spinal canal stenosis. No lytic or blastic lesion. OTHER NECK: Normal pharynx, larynx and major salivary glands. No cervical lymphadenopathy. Unremarkable thyroid gland. UPPER CHEST: No pneumothorax or pleural effusion. No nodules or masses. CTA HEAD FINDINGS ANTERIOR CIRCULATION: --Intracranial internal carotid arteries: Normal. --Anterior cerebral arteries: Normal. Both A1 segments are present. Patent anterior communicating artery. --Middle cerebral arteries: Normal. --Posterior communicating arteries: Absent bilaterally. POSTERIOR CIRCULATION: --Basilar artery: Normal. --Posterior cerebral arteries: Normal. --Superior cerebellar arteries: Normal. --Inferior cerebellar arteries: Normal anterior and posterior inferior cerebellar arteries. VENOUS SINUSES: As permitted by contrast timing, patent. ANATOMIC VARIANTS: None DELAYED PHASE: No parenchymal contrast enhancement. Review of the MIP images confirms the above findings. IMPRESSION: 1. No emergent large vessel occlusion or high-grade intracranial stenosis. 2. Moderate narrowing of the left vertebral artery V4 segment secondary to atherosclerotic calcification. 3. Mild bilateral carotid atherosclerosis without hemodynamically significant stenosis. 4.  Aortic Atherosclerosis (ICD10-I70.0). 5. Moderate multifocal paranasal sinus disease, including left maxillary sinus fluid level. Correlate for signs of acute sinusitis. Electronically Signed   By: Ulyses Jarred M.D.   On: 08/17/2017 01:09   Ct Angio Neck W And/or Wo Contrast  Result Date: 08/17/2017 CLINICAL DATA:  Hervey Ard left-sided neck pain. EXAM: CT ANGIOGRAPHY HEAD AND NECK TECHNIQUE: Multidetector CT imaging of the head and neck was performed using the standard  protocol during bolus administration of intravenous contrast. Multiplanar CT image reconstructions and MIPs were obtained to evaluate the vascular anatomy. Carotid stenosis measurements (when applicable) are obtained utilizing NASCET criteria, using the distal internal carotid diameter as the denominator. CONTRAST:  165mL ISOVUE-370 IOPAMIDOL (ISOVUE-370) INJECTION 76% COMPARISON:  Head CT 06/06/2008 FINDINGS: CT HEAD FINDINGS BRAIN: No mass lesion, intraparenchymal hemorrhage or extra-axial collection. No evidence of acute cortical infarct. Normal appearance of the brain parenchyma and extra axial spaces for age. VASCULAR: Atherosclerotic calcification of the vertebral and internal carotid arteries at the skull base. No hyperdense vessel. SKULL: Normal visualized skull base, calvarium and extracranial soft tissues. SINUSES/ORBITS: Moderate bilateral maxillary, left frontal and left ethmoid mucosal thickening. Fluid level in left maxillary sinus. Normal orbits. CTA NECK FINDINGS AORTIC ARCH: There is moderate calcific atherosclerosis of the aortic arch. There is no aneurysm, dissection or hemodynamically significant stenosis of the visualized ascending aorta and aortic arch. Conventional 3 vessel aortic branching pattern. The visualized proximal subclavian arteries are widely patent. RIGHT CAROTID SYSTEM: --Common carotid artery: Widely patent origin without common carotid artery dissection or aneurysm. --Internal carotid artery: No dissection, occlusion or aneurysm. Mild atherosclerotic calcification at the carotid bifurcation without hemodynamically significant stenosis. --External carotid artery: No acute abnormality. LEFT CAROTID SYSTEM: --Common carotid artery: Widely patent origin without  common carotid artery dissection or aneurysm. --Internal carotid artery:No dissection, occlusion or aneurysm. No hemodynamically significant stenosis. --External carotid artery: No acute abnormality. VERTEBRAL ARTERIES: Right  dominant configuration. Both origins are normal. Moderate narrowing of the left V4 segment secondary to atherosclerotic calcification. SKELETON: There is no bony spinal canal stenosis. No lytic or blastic lesion. OTHER NECK: Normal pharynx, larynx and major salivary glands. No cervical lymphadenopathy. Unremarkable thyroid gland. UPPER CHEST: No pneumothorax or pleural effusion. No nodules or masses. CTA HEAD FINDINGS ANTERIOR CIRCULATION: --Intracranial internal carotid arteries: Normal. --Anterior cerebral arteries: Normal. Both A1 segments are present. Patent anterior communicating artery. --Middle cerebral arteries: Normal. --Posterior communicating arteries: Absent bilaterally. POSTERIOR CIRCULATION: --Basilar artery: Normal. --Posterior cerebral arteries: Normal. --Superior cerebellar arteries: Normal. --Inferior cerebellar arteries: Normal anterior and posterior inferior cerebellar arteries. VENOUS SINUSES: As permitted by contrast timing, patent. ANATOMIC VARIANTS: None DELAYED PHASE: No parenchymal contrast enhancement. Review of the MIP images confirms the above findings. IMPRESSION: 1. No emergent large vessel occlusion or high-grade intracranial stenosis. 2. Moderate narrowing of the left vertebral artery V4 segment secondary to atherosclerotic calcification. 3. Mild bilateral carotid atherosclerosis without hemodynamically significant stenosis. 4.  Aortic Atherosclerosis (ICD10-I70.0). 5. Moderate multifocal paranasal sinus disease, including left maxillary sinus fluid level. Correlate for signs of acute sinusitis. Electronically Signed   By: Ulyses Jarred M.D.   On: 08/17/2017 01:09   Ct Angio Chest/abd/pel For Dissection W And/or W/wo  Result Date: 08/17/2017 CLINICAL DATA:  Sharp left neck pain going down through the left shoulder and upper left arm. Onset of pain while driving at 3 p.m. EXAM: CT ANGIOGRAPHY CHEST, ABDOMEN AND PELVIS TECHNIQUE: Multidetector CT imaging through the chest, abdomen  and pelvis was performed using the standard protocol during bolus administration of intravenous contrast. Multiplanar reconstructed images and MIPs were obtained and reviewed to evaluate the vascular anatomy. CONTRAST:  137mL ISOVUE-370 IOPAMIDOL (ISOVUE-370) INJECTION 76% COMPARISON:  None. FINDINGS: CTA CHEST FINDINGS Cardiovascular: Noncontrast images of the chest demonstrate postoperative changes consistent with median sternotomy and previous coronary bypass. Calcification of the aorta and coronary arteries. No evidence of intramural hematoma in the aorta. Images obtained during the arterial phase after contrast injection demonstrates normal caliber thoracic aorta. No aortic dissection. Calcific and noncalcific plaque formation is present with focal ulcerated plaque demonstrated in the descending aorta. Great vessel origins are patent. Central pulmonary arteries are well opacified without evidence of significant pulmonary embolus. Heart size is normal. No pericardial effusions. Mediastinum/Nodes: No enlarged mediastinal, hilar, or axillary lymph nodes. Thyroid gland, trachea, and esophagus demonstrate no significant findings. Lungs/Pleura: Motion artifact limits the examination. Emphysematous changes throughout the lungs. Fibrosis in the lung bases. Noncalcified pulmonary nodule in the right apex measuring 7 mm diameter. No pleural effusions. No pneumothorax. Airways are patent. Musculoskeletal: Degenerative changes in the spine. Sternotomy wires. No destructive bone lesions. Review of the MIP images confirms the above findings. CTA ABDOMEN AND PELVIS FINDINGS VASCULAR Aorta: Normal caliber abdominal aorta. No aneurysm or dissection. No significant stenosis. Calcified and noncalcified plaque formation. Celiac: Patent without evidence of aneurysm, dissection, vasculitis or significant stenosis. SMA: Patent without evidence of aneurysm, dissection, vasculitis or significant stenosis. Renals: Both renal arteries  are patent without evidence of aneurysm, dissection, vasculitis, fibromuscular dysplasia or significant stenosis. IMA: Patent without evidence of aneurysm, dissection, vasculitis or significant stenosis. Inflow: Calcification at the origin of the iliac artery's bilaterally demonstrates evidence of moderate stenosis, possibly up to 50% diameter reduction. This is most prominent on the right. Mild stenosis  at the origin of the external iliac artery on the right. The vessels do remain patent throughout. Veins: No obvious venous abnormality within the limitations of this arterial phase study. Review of the MIP images confirms the above findings. NON-VASCULAR Hepatobiliary: No focal liver abnormality is seen. No gallstones, gallbladder wall thickening, or biliary dilatation. Pancreas: Unremarkable. No pancreatic ductal dilatation or surrounding inflammatory changes. Spleen: Normal in size without focal abnormality. Adrenals/Urinary Tract: Adrenal glands are unremarkable. Kidneys are normal, without renal calculi, focal lesion, or hydronephrosis. Bladder is unremarkable. Stomach/Bowel: Stomach is within normal limits. Appendix is not identified. No evidence of bowel wall thickening, distention, or inflammatory changes. Lymphatic: No significant lymphadenopathy. Reproductive: Prostate gland is enlarged, measuring 5.7 cm diameter. Other: No free air or free fluid in the abdomen. Abdominal wall musculature appears intact. Musculoskeletal: Degenerative changes in the spine. Postoperative posterior fixation at L4-5. Probable postoperative changes due to bone graft harvest at the right iliac bone. No destructive bone lesions. Review of the MIP images confirms the above findings. IMPRESSION: 1. No evidence of aneurysm or dissection involving the thoracic or abdominal aorta. Diffuse aortic atherosclerosis. Ulcerating plaque formation in the descending aorta. 2. No evidence of significant pulmonary embolus. 3. Emphysematous  changes and fibrosis in the lungs. No active consolidation. 4. 7 mm noncalcified nodule in the right apex. Non-contrast chest CT at 6-12 months is recommended. If the nodule is stable at time of repeat CT, then future CT at 18-24 months (from today's scan) is considered optional for low-risk patients, but is recommended for high-risk patients. This recommendation follows the consensus statement: Guidelines for Management of Incidental Pulmonary Nodules Detected on CT Images: From the Fleischner Society 2017; Radiology 2017; 284:228-243. 5. Atherosclerotic changes in the iliac arteries bilaterally suggest mild to moderate stenosis although the vessels remain patent throughout. 6. Enlarged prostate gland. Electronically Signed   By: Lucienne Capers M.D.   On: 08/17/2017 01:14   IMPRESSION AND PLAN:   A/P: 72M L neck pain/HA.  1.) L neck pain/HA: Pt p/w 1d Hx L neck pain, as per HPI. Neurologically non-focal. HA features not suggestive of migraine, cluster HA, temporal arteritis or other causes. Narrative and physical exam findings favor a musculoskeletal/radicular process, such as cervical radiculopathy or occipital neuralgia, as the cause for pt's symptoms. Unrelated to arm movement, thoracic outlet unlikely. CTA imaging of the head, neck and chest performed in the ED (-) aneurysm, dissection, clot or hemodynamically-significant arterial stenosis that would provide an explanation for the pt's symptoms. As such, pt ordered for MRI C-spine w/o contrast. Symptomatic mgmt, pain ctrl.  2.) Hyperglycemia: Glucose 159 on admission. HbA1c pending.  3.) Cr elevation: Cr 1.22 on admission. Cr was 1.0 as of 10/2014, but was 1.36 in 06/2013. 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 Does not meet criteria for AKI at present, but will monitor for AKI, as pt received IV contrast for CTA studies performed in ED. IVF, monitor BMP, avoid nephrotoxins.  4.) Leukocytosis: WBC 13.5 on admission. Afebrile, (-)  tachycardia/tachypnea/hypoxia. SIRS (-). (-) urinary symptoms, (-) cough/SOB. Extensive ED imaging (-) obvious infectious process. No clear etiology for leukocytosis, possibly reactive. Monitor.  5.) HTN: c/w Atenolol, Cozaar.  6.) HLD/CAD/MI: c/w ASA, Statin, beta blocker, ARB.  7.) BPH: c/w Flomax.  8.) Gout: c/w Colchicine.  9.) FEN/GI: Cardiac diet, IVF LR.  10.) DVT PPx: Lovenox 40mg  SQ qD.  11.) Code status: Full code.  12.) Disposition: Observation, pt expected to stay < 2 midnights.   All  the records are reviewed and case discussed with ED provider. Management plans discussed with the patient, family and they are in agreement.  CODE STATUS: Full code.  TOTAL TIME TAKING CARE OF THIS PATIENT: 75 minutes.    Arta Silence M.D on 08/17/2017 at 4:31 AM  Between 7am to 6pm - Pager - [phone]  After 6pm go to [email or web address] - Proofreader  [employer] [place] [facility]  Office  [phone]  CC: Primary care physician; Tracie Harrier, MD   Note: This dictation was prepared with Dragon dictation along with smaller phrase technology. Any transcriptional errors that result from this process are unintentional.

## 2017-08-17 NOTE — ED Notes (Signed)
Patient decided to withhold Fentanyl at this time d/t reaction before that is unknown for sure if it was caused by Fentanyl.

## 2017-08-17 NOTE — Progress Notes (Signed)
Pt VS stable. A&Ox4. Discharge instructions and new medication education given to patient. Pt. Verbalized understanding. 1 prescription given to patient, instructed to keep follow up appointments. Pt discharged home, wife to transport home.  Kyla Balzarine, LPN

## 2017-08-17 NOTE — Care Management Obs Status (Signed)
[place] NOTIFICATION   Patient Details  Name: Harry ZACCONE Sr. MRN: [id number] Date of Birth: 1945-08-03   Medicare Observation Status Notification Given:  Yes    Katrina Stack, RN 08/17/2017, 11:05 AM

## 2017-08-24 NOTE — Discharge Summary (Signed)
[place] [facility] [place] NAME: Harry Pacheco    MR#:  [id number]  DATE OF BIRTH:  02/15/1946  DATE OF ADMISSION:  08/16/2017 ADMITTING PHYSICIAN: Arta Silence, MD  DATE OF DISCHARGE: 08/17/2017  4:05 PM  PRIMARY CARE PHYSICIAN: Tracie Harrier, MD    ADMISSION DIAGNOSIS:  Neck pain [M54.2] [place]cl[place] headache [G44.53]  DISCHARGE DIAGNOSIS:  Active Problems:   Episodic tension-type headache, intractable   SECONDARY DIAGNOSIS:   Past Medical History:  Diagnosis Date  . Chronic back pain   . Hyperlipemia   . Hypertension   . MI (myocardial infarction) [facility]     HOSPITAL COURSE:   Monitored in hospital for headache and c/o neck pain. Work up was done including CT angio head and neck- which was not significant blockages. MRI cervical spines was also done, which showed some arthritis changes,a nd I discussed with neuro surgeon on phone- he suggested to follow in clinic with them. No further work ups. Pt was feeling much better so discharged home.   DISCHARGE CONDITIONS:   Stable.  CONSULTS OBTAINED:  Treatment Team:  Arta Silence, MD  DRUG ALLERGIES:   Allergies  Allergen Reactions  . Bee Venom Anaphylaxis  . Penicillins Rash    DISCHARGE MEDICATIONS:   Allergies as of 08/17/2017      Reactions   Bee Venom Anaphylaxis   Penicillins Rash      Medication List    STOP taking these medications   azithromycin 250 MG tablet Commonly known as:  ZITHROMAX     TAKE these medications   amitriptyline 50 MG tablet Commonly known as:  ELAVIL TAKE 1 TABLET BY MOUTH AT BEDTIME. PLEASE FILL ON 04/09/2013   aspirin 325 MG tablet Take 325 mg by mouth daily.   atenolol 100 MG tablet Commonly known as:  TENORMIN Take 100 mg by mouth daily.   butalbital-acetaminophen-caffeine 50-325-40 MG tablet Commonly known as:  FIORICET, ESGIC Take 1 tablet by mouth every 6 (six) hours as needed for headache.    colchicine 0.6 MG tablet Take 1.2mg  (2 tablets), then may take 0.6mg  1 hr later if symptoms not improved. Then, use 0.6mg  daily if flare continues.   EPIPEN 2-PAK 0.3 mg/0.3 mL Soaj injection Generic drug:  EPINEPHrine INJECT  INTO THE MUSCLE ONCE PRF ANAPHYLAXIS   fluticasone 50 MCG/ACT nasal spray Commonly known as:  FLONASE Place 1 spray into both nostrils daily.   indomethacin 50 MG capsule Commonly known as:  INDOCIN Take 1 capsule (50 mg total) by mouth 3 (three) times daily with meals.   losartan 50 MG tablet Commonly known as:  COZAAR Take 1 tablet (50 mg total) by mouth daily.   simvastatin 20 MG tablet Commonly known as:  ZOCOR Take 1 tablet (20 mg total) by mouth every evening.   tamsulosin 0.4 MG Caps capsule Commonly known as:  FLOMAX Take 0.4 mg by mouth daily.        DISCHARGE INSTRUCTIONS:    Follow in neurosurgery clinic in 1 week.  If you experience worsening of your admission symptoms, develop shortness of breath, life threatening emergency, suicidal or homicidal thoughts you must seek medical attention immediately by calling 911 or calling your MD immediately  if symptoms less severe.  You Must read complete instructions/literature along with all the possible adverse reactions/side effects for all the Medicines you take and that have been prescribed to you. Take any new Medicines after you have completely understood and accept all the  possible adverse reactions/side effects.   Please note  You were cared for by a hospitalist during your hospital stay. If you have any questions about your discharge medications or the care you received while you were in the hospital after you are discharged, you can call the unit and asked to speak with the hospitalist on call if the hospitalist that took care of you is not available. Once you are discharged, your primary care physician will handle any further medical issues. Please note that NO REFILLS for any discharge  medications will be authorized once you are discharged, as it is imperative that you return to your primary care physician (or establish a relationship with a primary care physician if you do not have one) for your aftercare needs so that they can reassess your need for medications and monitor your lab values.    Today   CHIEF COMPLAINT:   Chief Complaint  Patient presents with  . Neck Pain    HISTORY OF PRESENT ILLNESS:  Harry Pacheco  is a 72 y.o. male with a known history of HTN, HLD, CAD/MI (s/p PTCA x2, 1994; CABG x3, 2002), AAA (2.8 x 2.7, 06/2013), BPH, gout, nephrolithiasis, insomnia, DJD/OA p/w 1d Hx L neck pain + HA. Pt states he has not had this issue in the past. He states that @~1600PM on Tuesday (08/16/2017), he was driving his truck, and stopped in order to look left and check for traffic. When he tried to turn his head left, he was unable to do so. He developed a sudden-onset sharp severe pain along the left side of his neck, from the base of the L neck radiating up behind the L ear and up the L posterior neck, into the posterior scalp/L occiput and over the L temporo-parietal scalp. He denies pain over the face and forehead. He denies frontal headache or pain behind the eyes. He denies photophobia/phonophobia, blurred vision, acute changes in hearing/vision, N/V, vertigo, imbalance/dysequilibrium, sensory/motor deficits, unilateral weakness, LH or LOC. He denies obvious trauma or injury. He states that he managed to drive himself home.  At home, pt states the pain was constant, but would intermittently flare up, and continued to get progressively worse until ~2100-2130PM. Pt states he tried to go to sleep, but was unable to do so. He states the neck pain and headache/scalp pain are positional (worsened by any head and neck movements, but especially w/ turning neck L or R, L > R) and reproducible w/ palpation and active/passive range of motion of the neck and head. The pain is not  affected by arm/shoulder movements. At the pain continued, pt also endorsed neck stiffness [however, (-) Kernig, (-) Brudzinski, (-) nuchal rigidity]. He states he got out of bed and discussed his worsening symptoms with his wife. They agreed that the pt should come to the hospital, and his wife subsequently drove him to the ED for evaluation.  At the time of my Hx/examination, pt denies F/C/N/V/D/AP, CP, SOB, palpitations, diaphoresis, rigors, night sweats, weight loss, cough, hemoptysis, wheezing, urinary symptoms. He is well-appearing and in no acute distress. He endorses a Hx of L rotator cuff repair surgery. He states he has been on Flonase and Azithromycin (Z-Pak) since Sunday, for treatment of sinusitis (which has since improved). He is otherwise w/o complaint.   VITAL SIGNS:  Blood pressure 134/71, pulse 69, temperature 98.4 F (36.9 C), temperature source Oral, resp. rate (!) 22, height 6' (1.829 m), weight 94.3 kg (208 lb), SpO2 96 %.  I/O:  No intake or output data in the 24 hours ending 08/24/17 1808  PHYSICAL EXAMINATION:  GENERAL:  72 y.o.-year-old patient lying in the bed with no acute distress.  EYES: Pupils equal, round, reactive to light and accommodation. No scleral icterus. Extraocular muscles intact.  HEENT: Head atraumatic, normocephalic. Oropharynx and nasopharynx clear.  NECK:  Supple, no jugular venous distention. No thyroid enlargement, no tenderness.  LUNGS: Normal breath sounds bilaterally, no wheezing, rales,rhonchi or crepitation. No use of accessory muscles of respiration.  CARDIOVASCULAR: S1, S2 normal. No murmurs, rubs, or gallops.  ABDOMEN: Soft, non-tender, non-distended. Bowel sounds present. No organomegaly or mass.  EXTREMITIES: No pedal edema, cyanosis, or clubbing.  NEUROLOGIC: Cranial nerves II through XII are intact. Muscle strength 5/5 in all extremities. Sensation intact. Gait not checked.  PSYCHIATRIC: The patient is alert and oriented x 3.  SKIN:  No obvious rash, lesion, or ulcer.   DATA REVIEW:   CBC No results for input(s): WBC, HGB, HCT, PLT in the last 168 hours.  Chemistries  No results for input(s): NA, K, CL, CO2, GLUCOSE, BUN, CREATININE, CALCIUM, MG, AST, ALT, ALKPHOS, BILITOT in the last 168 hours.  Invalid input(s): GFRCGP  Cardiac Enzymes No results for input(s): TROPONINI in the last 168 hours.  Microbiology Results  Results for orders placed or performed in visit on 41/66/06  Helicobacter pylori special antigen     Status: None   Collection Time: 06/23/11 12:00 AM  Result Value Ref Range Status   H. PYLORI Antigen  NEGATIVE  Final   H. PYLORI Antigen  Antimicrobials,proton pump inhibitors and bismuth  Final   H. PYLORI Antigen    Final    preparations are known to suppress H.pylori and ingestion of   H. PYLORI Antigen    Final    these prior to testing may cause a false negative result. If   H. PYLORI Antigen    Final    a negative result is obtained for a patient that has    H. PYLORI Antigen  ingested these compounds within two weeks prior of  Final   H. PYLORI Antigen    Final    performing the H.pylori test, results may be falsely   H. PYLORI Antigen    Final    negative and should be repeated with a new specimen two   H. PYLORI Antigen  weeks after discontinuing treatment.  Final  Stool Culture     Status: None   Collection Time: 06/23/11 10:53 AM  Result Value Ref Range Status   Organism ID, Bacteria No Salmonella,Shigella,Campylobacter or Yersinia  Final   Organism ID, Bacteria isolated.  Final    RADIOLOGY:  No results found.  EKG:   Orders placed or performed during the hospital encounter of 08/16/17  . EKG 12-Lead  . EKG 12-Lead  . ED EKG  . ED EKG  . EKG      Management plans discussed with the patient, family and they are in agreement.  CODE STATUS:  Code Status History    Date Active Date Inactive Code Status Order ID Comments User Context   08/17/2017 0513 08/17/2017 1911  Full Code [id number]  Arta Silence, MD Inpatient      TOTAL TIME TAKING CARE OF THIS PATIENT: 35 minutes.    Vaughan Basta M.D on 08/24/2017 at 6:08 PM  Between 7am to 6pm - Pager - [phone]  After 6pm go to [email or web address] - Proofreader  [employer]  [phone]  CC: Primary care physician; Tracie Harrier, MD   Note: This dictation was prepared with Dragon dictation along with smaller phrase technology. Any transcriptional errors that result from this process are unintentional.

## 2018-02-21 ENCOUNTER — Other Ambulatory Visit: Payer: Self-pay | Admitting: Internal Medicine

## 2018-02-21 DIAGNOSIS — R911 Solitary pulmonary nodule: Secondary | ICD-10-CM

## 2018-03-27 ENCOUNTER — Ambulatory Visit
Admission: RE | Admit: 2018-03-27 | Discharge: 2018-03-27 | Disposition: A | Discharge status: Home / Self Care | Payer: Medicare Other | Source: Ambulatory Visit | Attending: Internal Medicine | Admitting: Internal Medicine

## 2018-03-27 DIAGNOSIS — R911 Solitary pulmonary nodule: Secondary | ICD-10-CM

## 2018-07-10 ENCOUNTER — Other Ambulatory Visit: Payer: Self-pay | Admitting: Internal Medicine

## 2018-07-10 DIAGNOSIS — R911 Solitary pulmonary nodule: Secondary | ICD-10-CM

## 2018-09-22 ENCOUNTER — Ambulatory Visit
Admission: RE | Admit: 2018-09-22 | Discharge: 2018-09-22 | Disposition: A | Discharge status: Home / Self Care | Payer: Medicare Other | Source: Ambulatory Visit | Attending: Internal Medicine | Admitting: Internal Medicine

## 2018-09-22 ENCOUNTER — Other Ambulatory Visit: Payer: Self-pay

## 2018-09-22 DIAGNOSIS — R911 Solitary pulmonary nodule: Secondary | ICD-10-CM | POA: Insufficient documentation

## 2018-10-11 ENCOUNTER — Other Ambulatory Visit: Payer: Self-pay | Admitting: Internal Medicine

## 2018-10-11 DIAGNOSIS — R911 Solitary pulmonary nodule: Secondary | ICD-10-CM

## 2019-02-16 ENCOUNTER — Other Ambulatory Visit: Payer: Self-pay

## 2019-02-16 ENCOUNTER — Other Ambulatory Visit
Admission: RE | Admit: 2019-02-16 | Discharge: 2019-02-16 | Disposition: A | Discharge status: Home / Self Care | Payer: Medicare Other | Source: Ambulatory Visit | Attending: Internal Medicine | Admitting: Internal Medicine

## 2019-02-16 DIAGNOSIS — Z20828 Contact with and (suspected) exposure to other viral communicable diseases: Secondary | ICD-10-CM | POA: Insufficient documentation

## 2019-02-16 DIAGNOSIS — Z01812 Encounter for preprocedural laboratory examination: Secondary | ICD-10-CM | POA: Diagnosis present

## 2019-02-16 LAB — SARS CORONAVIRUS 2 (TAT 6-24 HRS): SARS Coronavirus 2: NEGATIVE

## 2019-02-21 ENCOUNTER — Ambulatory Visit
Admission: RE | Admit: 2019-02-21 | Discharge: 2019-02-21 | Disposition: A | Discharge status: Home / Self Care | Payer: Medicare Other | Attending: Internal Medicine | Admitting: Internal Medicine

## 2019-02-21 ENCOUNTER — Encounter: Payer: Self-pay | Admitting: Anesthesiology

## 2019-02-21 ENCOUNTER — Other Ambulatory Visit: Payer: Self-pay

## 2019-02-21 ENCOUNTER — Ambulatory Visit: Payer: Medicare Other | Admitting: Anesthesiology

## 2019-02-21 ENCOUNTER — Encounter
Admission: RE | Disposition: A | Discharge status: Home / Self Care | Payer: Self-pay | Source: Home / Self Care | Attending: Internal Medicine

## 2019-02-21 DIAGNOSIS — Z880 Allergy status to penicillin: Secondary | ICD-10-CM | POA: Diagnosis not present

## 2019-02-21 DIAGNOSIS — I2510 Atherosclerotic heart disease of native coronary artery without angina pectoris: Secondary | ICD-10-CM | POA: Diagnosis not present

## 2019-02-21 DIAGNOSIS — E785 Hyperlipidemia, unspecified: Secondary | ICD-10-CM | POA: Insufficient documentation

## 2019-02-21 DIAGNOSIS — Z09 Encounter for follow-up examination after completed treatment for conditions other than malignant neoplasm: Secondary | ICD-10-CM | POA: Insufficient documentation

## 2019-02-21 DIAGNOSIS — Z885 Allergy status to narcotic agent status: Secondary | ICD-10-CM | POA: Insufficient documentation

## 2019-02-21 DIAGNOSIS — I252 Old myocardial infarction: Secondary | ICD-10-CM | POA: Insufficient documentation

## 2019-02-21 DIAGNOSIS — Z86010 Personal history of colonic polyps: Secondary | ICD-10-CM | POA: Diagnosis not present

## 2019-02-21 DIAGNOSIS — Z87891 Personal history of nicotine dependence: Secondary | ICD-10-CM | POA: Diagnosis not present

## 2019-02-21 DIAGNOSIS — I10 Essential (primary) hypertension: Secondary | ICD-10-CM | POA: Insufficient documentation

## 2019-02-21 DIAGNOSIS — M549 Dorsalgia, unspecified: Secondary | ICD-10-CM | POA: Diagnosis not present

## 2019-02-21 DIAGNOSIS — K640 First degree hemorrhoids: Secondary | ICD-10-CM | POA: Diagnosis not present

## 2019-02-21 DIAGNOSIS — Z951 Presence of aortocoronary bypass graft: Secondary | ICD-10-CM | POA: Diagnosis not present

## 2019-02-21 DIAGNOSIS — D128 Benign neoplasm of rectum: Secondary | ICD-10-CM | POA: Diagnosis not present

## 2019-02-21 DIAGNOSIS — K5730 Diverticulosis of large intestine without perforation or abscess without bleeding: Secondary | ICD-10-CM | POA: Diagnosis not present

## 2019-02-21 DIAGNOSIS — Z7984 Long term (current) use of oral hypoglycemic drugs: Secondary | ICD-10-CM | POA: Insufficient documentation

## 2019-02-21 DIAGNOSIS — Z79899 Other long term (current) drug therapy: Secondary | ICD-10-CM | POA: Insufficient documentation

## 2019-02-21 DIAGNOSIS — Z7982 Long term (current) use of aspirin: Secondary | ICD-10-CM | POA: Diagnosis not present

## 2019-02-21 HISTORY — DX: Atherosclerotic heart disease of native coronary artery without angina pectoris: I25.10

## 2019-02-21 HISTORY — DX: Benign neoplasm of colon, unspecified: D12.6

## 2019-02-21 HISTORY — DX: Polyp of colon: K63.5

## 2019-02-21 HISTORY — PX: COLONOSCOPY WITH PROPOFOL: SHX5780

## 2019-02-21 SURGERY — COLONOSCOPY WITH PROPOFOL
Anesthesia: General

## 2019-02-21 MED ORDER — FENTANYL CITRATE (PF) 100 MCG/2ML IJ SOLN
INTRAMUSCULAR | Status: DC | PRN
  Administered 2019-02-21 (×2): 50 ug via INTRAVENOUS

## 2019-02-21 MED ORDER — EPHEDRINE SULFATE 50 MG/ML IJ SOLN
INTRAMUSCULAR | Status: DC | PRN
  Administered 2019-02-21: 10 mg via INTRAVENOUS

## 2019-02-21 MED ORDER — MIDAZOLAM HCL 2 MG/2ML IJ SOLN
INTRAMUSCULAR | Status: DC | PRN
  Administered 2019-02-21 (×2): 1 mg via INTRAVENOUS

## 2019-02-21 MED ORDER — PROPOFOL 10 MG/ML IV BOLUS
INTRAVENOUS | Status: DC | PRN
  Administered 2019-02-21: 50 mg via INTRAVENOUS

## 2019-02-21 MED ORDER — FENTANYL CITRATE (PF) 100 MCG/2ML IJ SOLN
INTRAMUSCULAR | Status: AC
  Filled 2019-02-21: qty 2

## 2019-02-21 MED ORDER — MIDAZOLAM HCL 2 MG/2ML IJ SOLN
INTRAMUSCULAR | Status: AC
  Filled 2019-02-21: qty 2

## 2019-02-21 MED ORDER — PROPOFOL 500 MG/50ML IV EMUL
INTRAVENOUS | Status: DC | PRN
  Administered 2019-02-21: 75 ug/kg/min via INTRAVENOUS

## 2019-02-21 MED ORDER — SODIUM CHLORIDE 0.9 % IV SOLN
INTRAVENOUS | Status: DC
  Administered 2019-02-21: 13:00:00 via INTRAVENOUS

## 2019-02-21 NOTE — Anesthesia Preprocedure Evaluation (Addendum)
Anesthesia Evaluation  Patient identified by MRN, date of birth, ID band Patient awake    Reviewed: Allergy & Precautions, NPO status , Patient's Chart, lab work & pertinent test results  History of Anesthesia Complications Negative for: history of anesthetic complications  Airway Mallampati: II       Dental  (+) Teeth Intact, Partial Lower   Pulmonary neg pulmonary ROS, former smoker,    breath sounds clear to auscultation       Cardiovascular Exercise Tolerance: Good hypertension, Pt. on home beta blockers (-) angina+ CAD, + Past MI and + CABG  (-) Cardiac Stents (-) dysrhythmias (-) Valvular Problems/Murmurs Rhythm:Regular Rate:Normal     Neuro/Psych negative neurological ROS     GI/Hepatic negative GI ROS, Neg liver ROS,   Endo/Other  negative endocrine ROS  Renal/GU negative Renal ROS     Musculoskeletal negative musculoskeletal ROS (+)   Abdominal Normal abdominal exam  (+)   Peds  Hematology negative hematology ROS (+)   Anesthesia Other Findings Past Medical History: No date: Chronic back pain No date: Coronary artery disease No date: Hyperlipemia No date: Hyperplastic colon polyp No date: Hypertension No date: MI (myocardial infarction) ([place]) No date: Tubular adenoma of colon   Reproductive/Obstetrics negative OB ROS                            Anesthesia Physical  Anesthesia Plan  ASA: II  Anesthesia Plan: General   Post-op Pain Management:    Induction: Intravenous  PONV Risk Score and Plan: 2 and Propofol infusion and TIVA  Airway Management Planned: Natural Airway and Nasal Cannula  Additional Equipment:   Intra-op Plan:   Post-operative Plan:   Informed Consent: I have reviewed the patients History and Physical, chart, labs and discussed the procedure including the risks, benefits and alternatives for the proposed anesthesia with the patient or  authorized representative who has indicated his/her understanding and acceptance.       Plan Discussed with: CRNA  Anesthesia Plan Comments:         Anesthesia Quick Evaluation

## 2019-02-21 NOTE — Anesthesia Post-op Follow-up Note (Signed)
Anesthesia QCDR form completed.        

## 2019-02-21 NOTE — Transfer of Care (Signed)
Immediate Anesthesia Transfer of Care Note  Patient: Harry CRUTCHER Sr.  Procedure(s) Performed: COLONOSCOPY WITH PROPOFOL (N/A )  Patient Location: PACU  Anesthesia Type:General  Level of Consciousness: awake, alert  and oriented  Airway & Oxygen Therapy: Patient Spontanous Breathing and Patient connected to nasal cannula oxygen  Post-op Assessment: Report given to RN, Post -op Vital signs reviewed and stable and Patient moving all extremities  Post vital signs: Reviewed and stable  Last Vitals:  Vitals Value Taken Time  BP    Temp    Pulse    Resp    SpO2      Last Pain:  Vitals:   02/21/19 1310  TempSrc: Tympanic  PainSc: 0-No pain         Complications: No apparent anesthesia complications

## 2019-02-21 NOTE — Anesthesia Preprocedure Evaluation (Deleted)
Anesthesia Evaluation  Patient identified by MRN, date of birth, ID band Patient awake    Reviewed: Allergy & Precautions, NPO status , Patient's Chart, lab work & pertinent test results  Airway Mallampati: II       Dental  (+) Teeth Intact, Partial Lower   Pulmonary neg pulmonary ROS, former smoker,    breath sounds clear to auscultation       Cardiovascular Exercise Tolerance: Good hypertension, Pt. on home beta blockers + CAD, + Past MI and + CABG   Rhythm:Regular Rate:Normal     Neuro/Psych  Headaches,    GI/Hepatic negative GI ROS, Neg liver ROS,   Endo/Other  negative endocrine ROS  Renal/GU negative Renal ROS     Musculoskeletal negative musculoskeletal ROS (+)   Abdominal Normal abdominal exam  (+)   Peds  Hematology negative hematology ROS (+)   Anesthesia Other Findings   Reproductive/Obstetrics                             Anesthesia Physical  Anesthesia Plan  ASA: III  Anesthesia Plan: General   Post-op Pain Management:    Induction: Intravenous  PONV Risk Score and Plan:   Airway Management Planned: Natural Airway and Nasal Cannula  Additional Equipment:   Intra-op Plan:   Post-operative Plan:   Informed Consent: I have reviewed the patients History and Physical, chart, labs and discussed the procedure including the risks, benefits and alternatives for the proposed anesthesia with the patient or authorized representative who has indicated his/her understanding and acceptance.       Plan Discussed with: CRNA  Anesthesia Plan Comments:         Anesthesia Quick Evaluation

## 2019-02-21 NOTE — H&P (Signed)
Outpatient short stay form Pre-procedure 02/21/2019 11:55 AM Harry Pacheco K. [name], M.D.  Primary Physician: Tracie Harrier, M.D.  Reason for visit:  Personal hx of tubular adenomatous polyps of the colon - 2017  History of present illness:                            Patient presents for colonoscopy for a personal hx of colon polyps. The patient denies abdominal pain, abnormal weight loss or rectal bleeding.    No current facility-administered medications for this encounter.   Current Outpatient Medications:  .  metFORMIN (GLUCOPHAGE) 500 MG tablet, Take 500 mg by mouth daily with breakfast., Disp: , Rfl:  .  amitriptyline (ELAVIL) 50 MG tablet, TAKE 1 TABLET BY MOUTH AT BEDTIME. PLEASE FILL ON 04/09/2013, Disp: 30 tablet, Rfl: 6 .  aspirin 325 MG tablet, Take 325 mg by mouth daily., Disp: , Rfl:  .  atenolol (TENORMIN) 100 MG tablet, Take 100 mg by mouth daily., Disp: , Rfl:  .  colchicine 0.6 MG tablet, Take 1.2mg  (2 tablets), then may take 0.6mg  1 hr later if symptoms not improved. Then, use 0.6mg  daily if flare continues., Disp: 30 tablet, Rfl: 3 .  EPIPEN 2-PAK 0.3 MG/0.3ML SOAJ injection, INJECT  INTO THE MUSCLE ONCE PRF ANAPHYLAXIS, Disp: , Rfl: 1 .  fluticasone (FLONASE) 50 MCG/ACT nasal spray, Place 1 spray into both nostrils daily., Disp: , Rfl:  .  indomethacin (INDOCIN) 50 MG capsule, Take 1 capsule (50 mg total) by mouth 3 (three) times daily with meals. (Patient not taking: Reported on 08/17/2017), Disp: 30 capsule, Rfl: 3 .  losartan (COZAAR) 50 MG tablet, Take 1 tablet (50 mg total) by mouth daily., Disp: 90 tablet, Rfl: 3 .  simvastatin (ZOCOR) 20 MG tablet, Take 1 tablet (20 mg total) by mouth every evening., Disp: 90 tablet, Rfl: 3 .  tamsulosin (FLOMAX) 0.4 MG CAPS capsule, Take 0.4 mg by mouth daily. , Disp: , Rfl:   No medications prior to admission.     Allergies  Allergen Reactions  . Bee Venom Anaphylaxis  . Hornet Venom Anaphylaxis    Yellow hornet protein  .  Codeine Sulfate [Codeine]   . Morphine And Related   . Penicillins Rash     Past Medical History:  Diagnosis Date  . Chronic back pain   . Coronary artery disease   . Hyperlipemia   . Hyperplastic colon polyp   . Hypertension   . MI (myocardial infarction) ([place])   . Tubular adenoma of colon     Review of systems:  Otherwise negative.    Physical Exam  Gen: Alert, oriented. Appears stated age.  HEENT: [place]/AT. PERRLA. Lungs: CTA, no wheezes. CV: RR nl S1, S2. Abd: soft, benign, no masses. BS+ Ext: No edema. Pulses 2+    Planned procedures: Proceed with colonoscopy. The patient understands the nature of the planned procedure, indications, risks, alternatives and potential complications including but not limited to bleeding, infection, perforation, damage to internal organs and possible oversedation/side effects from anesthesia. The patient agrees and gives consent to proceed.  Please refer to procedure notes for findings, recommendations and patient disposition/instructions.     Harry Everett K. [name], M.D. Gastroenterology 02/21/2019  11:55 AM

## 2019-02-21 NOTE — Op Note (Signed)
[facility] Gastroenterology Patient Name: Harry Pacheco Procedure Date: 02/21/2019 12:39 PM MRN: [id number] Account #: [id number] Date of Birth: 08/24/1945 Admit Type: Outpatient Age: 73 Room: [facility] ENDO ROOM 3 Gender: Male Note Status: Finalized Procedure:             Colonoscopy Indications:           Surveillance: Personal history of adenomatous polyps                         on last colonoscopy > 3 years ago Providers:             Lorie Apley K. Pearlee Arvizu MD, MD Medicines:             Propofol per Anesthesia Complications:         No immediate complications. Procedure:             Pre-Anesthesia Assessment:                        - The risks and benefits of the procedure and the                         sedation options and risks were discussed with the                         patient. All questions were answered and informed                         consent was obtained.                        - Patient identification and proposed procedure were                         verified prior to the procedure by the nurse. The                         procedure was verified in the procedure room.                        - ASA Grade Assessment: III - A patient with severe                         systemic disease.                        - After reviewing the risks and benefits, the patient                         was deemed in satisfactory condition to undergo the                         procedure.                        After obtaining informed consent, the colonoscope was                         passed under direct vision. Throughout the procedure,  the patient's blood pressure, pulse, and oxygen                         saturations were monitored continuously. The                         Colonoscope was introduced through the anus and                         advanced to the the cecum, identified by appendiceal                         orifice and  ileocecal valve. The Colonoscope was                         introduced through the and advanced to the. The                         colonoscopy was somewhat difficult due to significant                         looping. Successful completion of the procedure was                         aided by changing the patient to a prone position. The                         patient tolerated the procedure well. The quality of                         the bowel preparation was good. The ileocecal valve,                         appendiceal orifice, and rectum were photographed. Findings:      The perianal and digital rectal examinations were normal. Pertinent       negatives include normal sphincter tone and no palpable rectal lesions.      A few medium-mouthed diverticula were found in the sigmoid colon.      Three sessile polyps were found in the rectum. The polyps were       diminutive in size. These polyps were removed with a cold biopsy       forceps. Resection and retrieval were complete.      Non-bleeding internal hemorrhoids were found during retroflexion. The       hemorrhoids were Grade I (internal hemorrhoids that do not prolapse).      The exam was otherwise without abnormality. Impression:            - Diverticulosis in the sigmoid colon.                        - Three diminutive polyps in the rectum, removed with                         a cold biopsy forceps. Resected and retrieved.                        - Non-bleeding internal hemorrhoids.                        -  The examination was otherwise normal. Recommendation:        - Patient has a contact number available for                         emergencies. The signs and symptoms of potential                         delayed complications were discussed with the patient.                         Return to normal activities tomorrow. Written                         discharge instructions were provided to the patient.                        -  Resume previous diet.                        - Continue present medications.                        - Await pathology results.                        - Repeat colonoscopy in 5 years for surveillance.                        - Return to GI office PRN.                        - The findings and recommendations were discussed with                         the patient. Procedure Code(s):     --- Professional ---                        [phone], Colonoscopy, flexible; with biopsy, single or                         multiple Diagnosis Code(s):     --- Professional ---                        K57.30, Diverticulosis of large intestine without                         perforation or abscess without bleeding                        K62.1, Rectal polyp                        K64.0, First degree hemorrhoids                        Z86.010, Personal history of colonic polyps CPT copyright 2019 [employer]. All rights reserved. The codes documented in this report are preliminary and upon coder review may  be revised to meet current compliance requirements. Efrain Sella MD, MD 02/21/2019 2:08:17 PM This report has been signed electronically.  Number of Addenda: 0 Note Initiated On: 02/21/2019 12:39 PM Scope Withdrawal Time: 0 hours 6 minutes 53 seconds  Total Procedure Duration: 0 hours 14 minutes 11 seconds  Estimated Blood Loss:  Estimated blood loss: none.      [facility]
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
## 2019-02-22 ENCOUNTER — Encounter: Payer: Self-pay | Admitting: Internal Medicine

## 2019-02-22 NOTE — Anesthesia Postprocedure Evaluation (Signed)
Anesthesia Post Note  Patient: GERMANY SCAFF Sr.  Procedure(s) Performed: COLONOSCOPY WITH PROPOFOL (N/A )  Patient location during evaluation: Endoscopy Anesthesia Type: General Level of consciousness: awake and alert Pain management: pain level controlled Vital Signs Assessment: post-procedure vital signs reviewed and stable Respiratory status: spontaneous breathing, nonlabored ventilation, respiratory function stable and patient connected to nasal cannula oxygen Cardiovascular status: blood pressure returned to baseline and stable Postop Assessment: no apparent nausea or vomiting Anesthetic complications: no     Last Vitals:  Vitals:   02/21/19 1421 02/21/19 1431  BP: 132/88 (!) 160/90  Pulse: 78 88  Resp: 16   Temp:    SpO2: 99% 98%    Last Pain:  Vitals:   02/22/19 0729  TempSrc:   PainSc: 0-No pain                 Martha Clan

## 2019-02-23 LAB — SURGICAL PATHOLOGY

## 2019-03-20 ENCOUNTER — Other Ambulatory Visit: Payer: Self-pay

## 2019-03-20 ENCOUNTER — Ambulatory Visit
Admission: RE | Admit: 2019-03-20 | Discharge: 2019-03-20 | Disposition: A | Discharge status: Home / Self Care | Payer: Medicare Other | Source: Ambulatory Visit | Attending: Internal Medicine | Admitting: Internal Medicine

## 2019-03-20 DIAGNOSIS — R911 Solitary pulmonary nodule: Secondary | ICD-10-CM | POA: Diagnosis not present

## 2019-06-14 ENCOUNTER — Other Ambulatory Visit: Payer: Self-pay | Admitting: Cardiology

## 2019-06-14 DIAGNOSIS — I714 Abdominal aortic aneurysm, without rupture, unspecified: Secondary | ICD-10-CM

## 2019-06-28 ENCOUNTER — Ambulatory Visit
Admission: RE | Admit: 2019-06-28 | Discharge: 2019-06-28 | Disposition: A | Discharge status: Home / Self Care | Payer: Medicare Other | Source: Ambulatory Visit | Attending: Cardiology | Admitting: Cardiology

## 2019-06-28 ENCOUNTER — Other Ambulatory Visit: Payer: Self-pay

## 2019-06-28 DIAGNOSIS — I714 Abdominal aortic aneurysm, without rupture, unspecified: Secondary | ICD-10-CM

## 2021-02-11 IMAGING — CT CT CHEST W/O CM
2 of 4 series · 15 of 36 positions shown, 18 images · non-contrast
Comparison: 09/22/2018

CLINICAL DATA: Follow-up of pulmonary nodule. Smoker. Asymptomatic.

EXAM:
CT CHEST WITHOUT CONTRAST
TECHNIQUE: Multidetector CT imaging of the chest was performed following the
standard protocol without IV contrast.

[Series 2: chest 2.00 · axial · 0.79mm/px · z∈[-1137,-861]mm · 12 of 164 slices shown, 15 images]
[im 13/164  mediastinal]
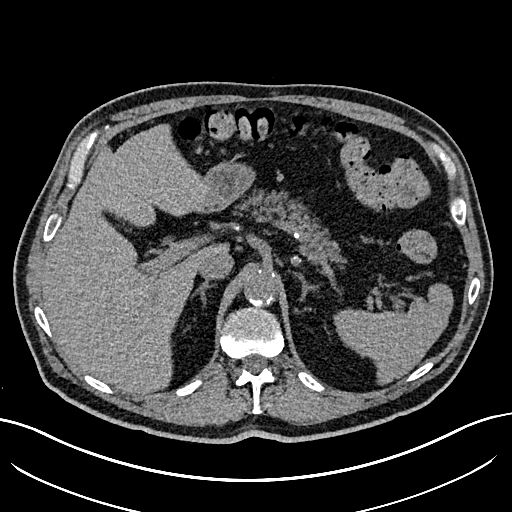
[im 13/164  lung]
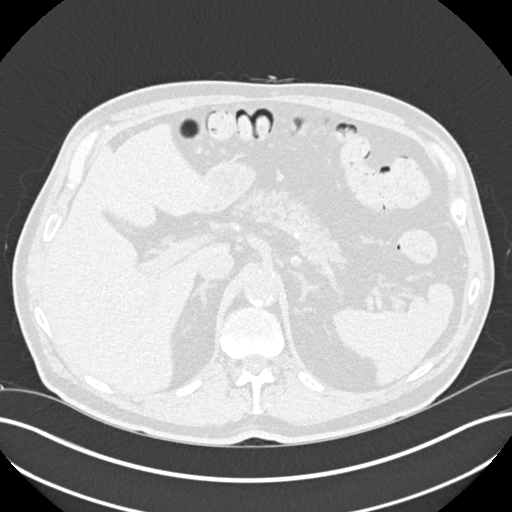
[im 26/164  lung]
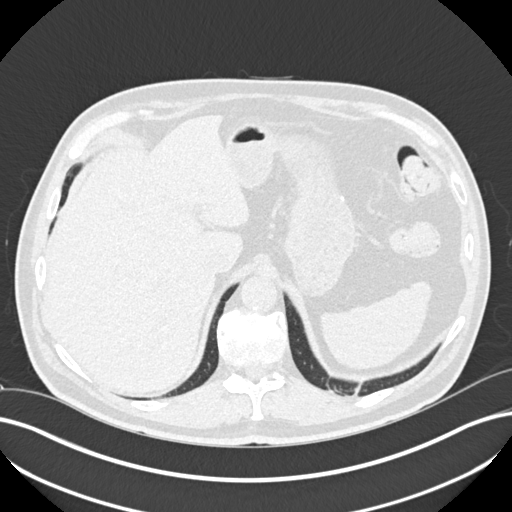
[im 38/164  lung]
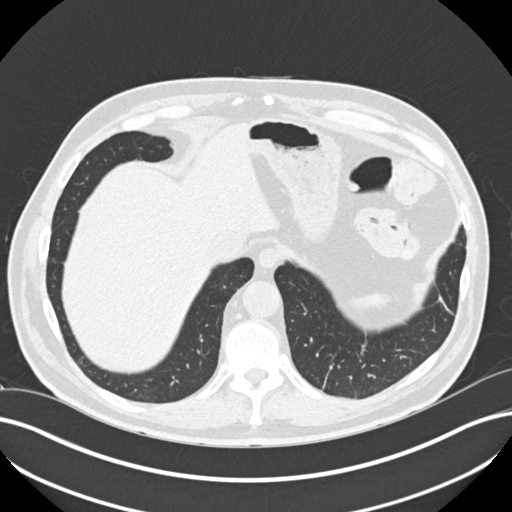
[im 51/164  lung]
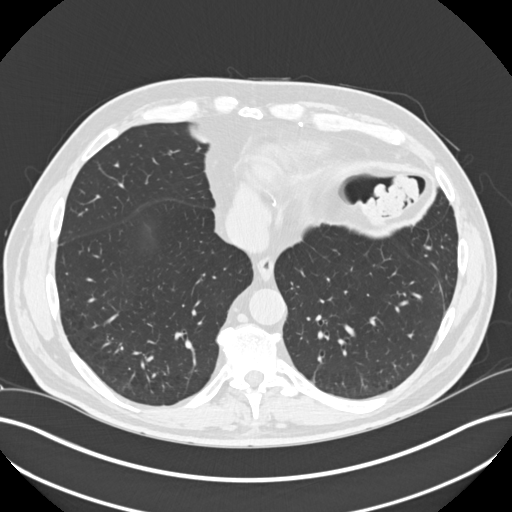
[im 63/164  mediastinal]
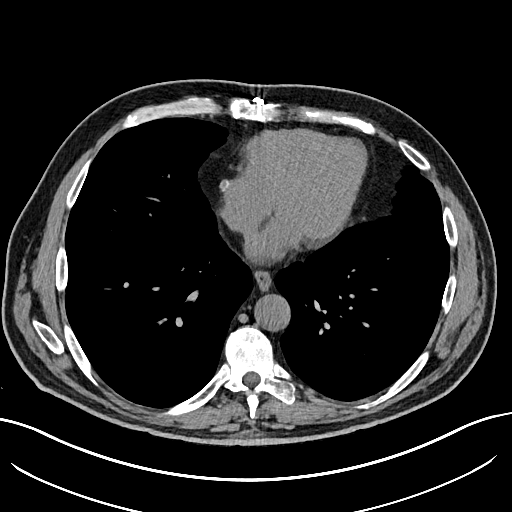
[im 63/164  lung]
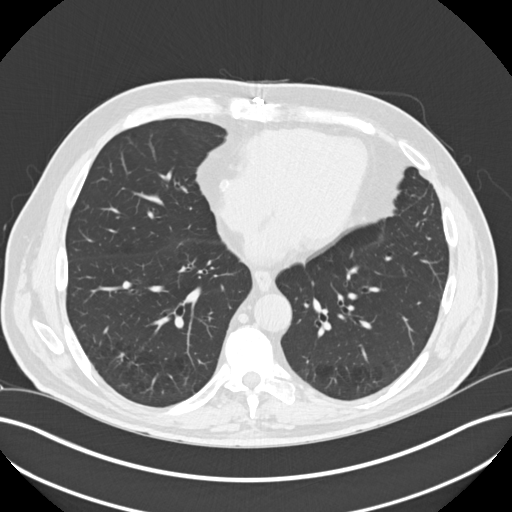
[im 76/164  lung]
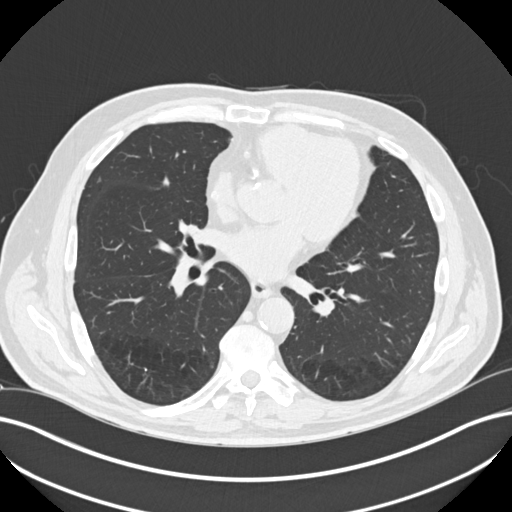
[im 88/164  lung]
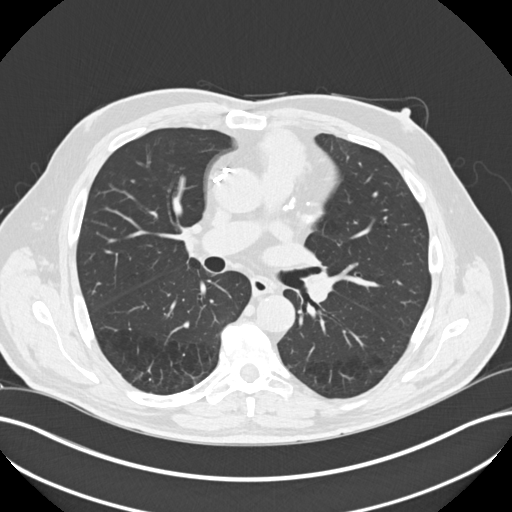
[im 101/164  lung]
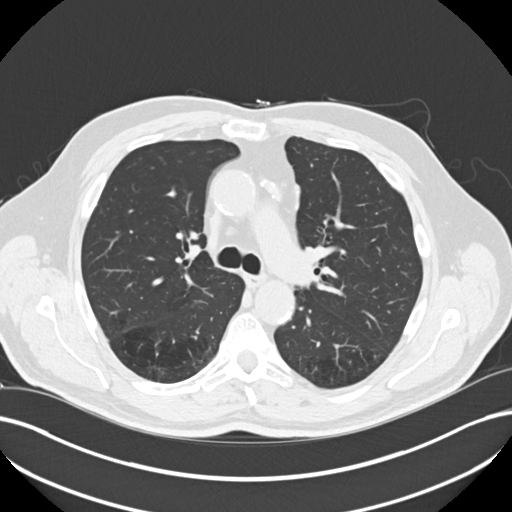
[im 113/164  mediastinal]
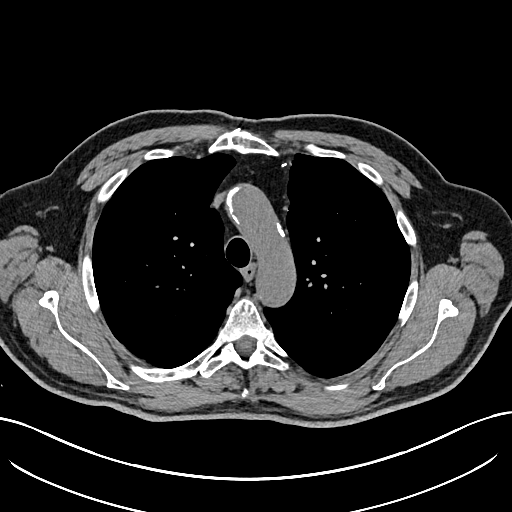
[im 113/164  lung]
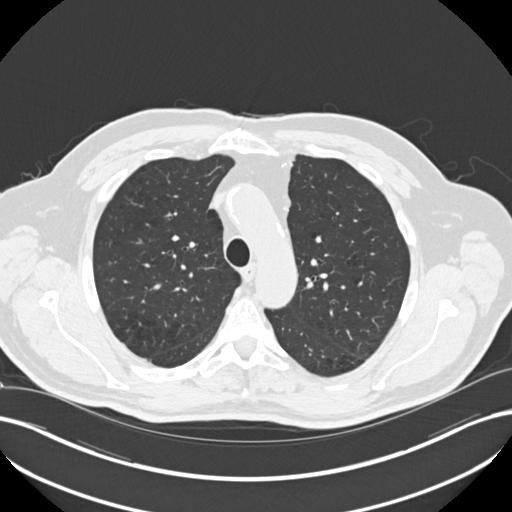
[im 126/164  lung]
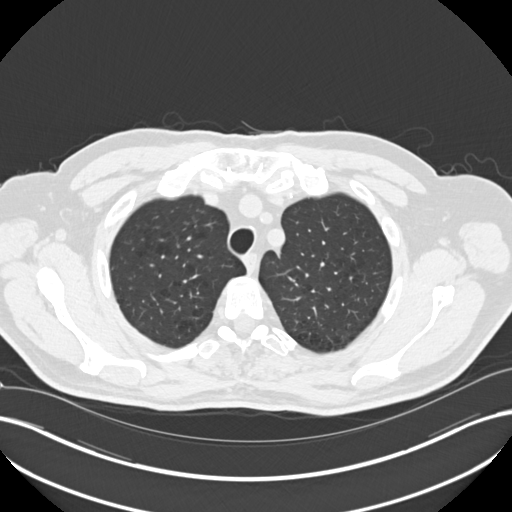
[im 138/164  lung]
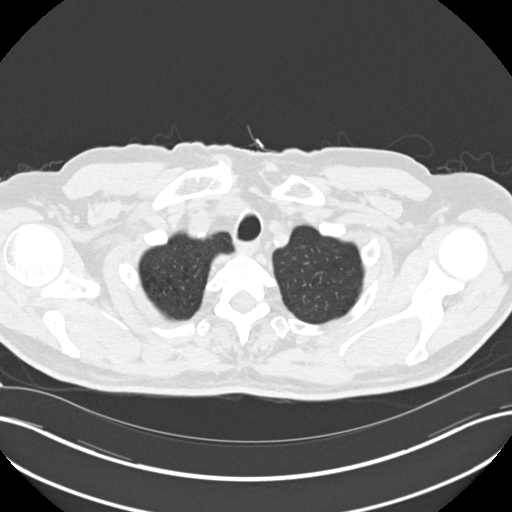
[im 151/164  lung]
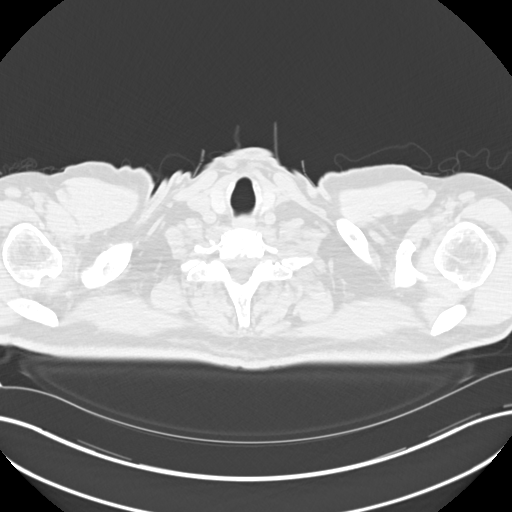

[Series 5: coronals chest 2.00 cor · coronal · 0.64mm/px · 3 of 195 slices shown]
[im 39/195  lung]
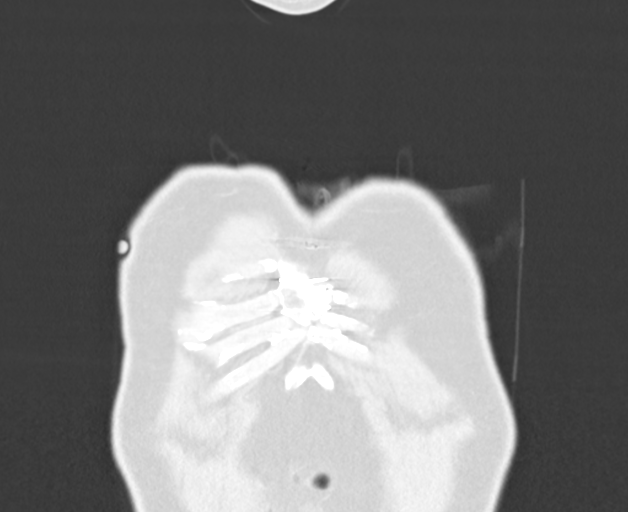
[im 78/195  lung]
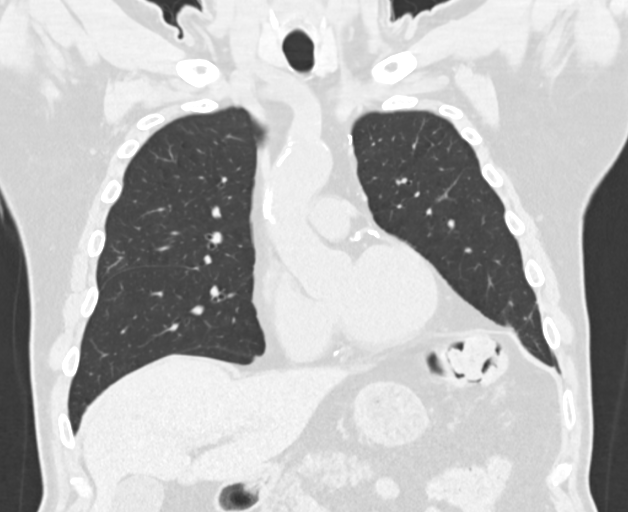
[im 117/195  lung]
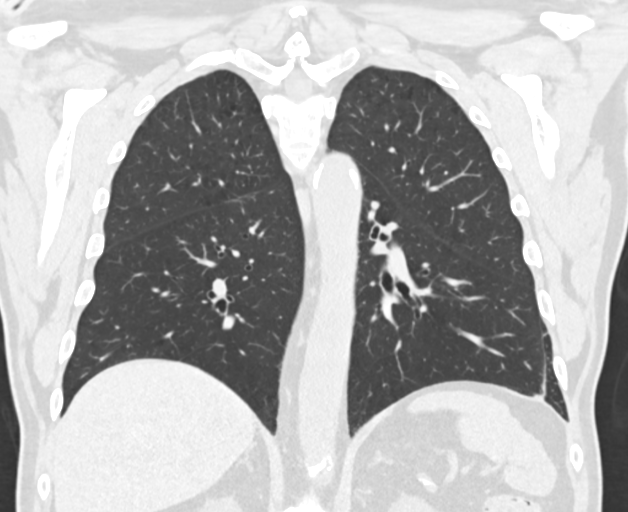

[15 of 36 positions shown; findings below may reference images not displayed]

FINDINGS: Cardiovascular: Aortic and branch vessel atherosclerosis. Normal
heart size, without pericardial effusion. Median sternotomy for
CABG.

Mediastinum/Nodes: No mediastinal or definite hilar adenopathy,
given limitations of unenhanced CT.

Lungs/Pleura: No pleural fluid.  Moderate centrilobular emphysema.

5 mm right upper lobe pulmonary nodule is similar, including on
33/3. Based on coronal reformats, maximally 7 mm today, similar back
to 03/27/2018.

Right lower lobe interstitial thickening is likely due to scarring.

Upper Abdomen: Normal imaged portions of the liver, spleen, stomach,
pancreas, gallbladder, adrenal glands, kidneys.

Musculoskeletal: Lower cervical and midthoracic spondylosis.
IMPRESSION: 1. Ongoing stability of a right upper lobe/apical pulmonary nodule
back to 03/27/2018, most consistent with a benign etiology.
2.  No acute process in the chest.
3. Aortic atherosclerosis (UPN48-ORH.H) and emphysema (UPN48-R9E.7).

## 2021-05-22 IMAGING — US US AORTA SCREENING (MEDICARE)
1 series · 14 of 25 positions shown · non-contrast
Comparison: CT the chest, abdomen and pelvis-08/17/2017

CLINICAL DATA: Abdominal aortic aneurysm. History of CAD,
hypertension and smoking.

EXAM:
ULTRASOUND OF ABDOMINAL AORTA
TECHNIQUE: Ultrasound examination of the abdominal aorta and proximal common
iliac arteries was performed to evaluate for aneurysm. Additional
color and Doppler images of the distal aorta were obtained to
document patency.

[Series 1: us aorta duplex limited · 14 of 32 slices shown]
[im 1/32]
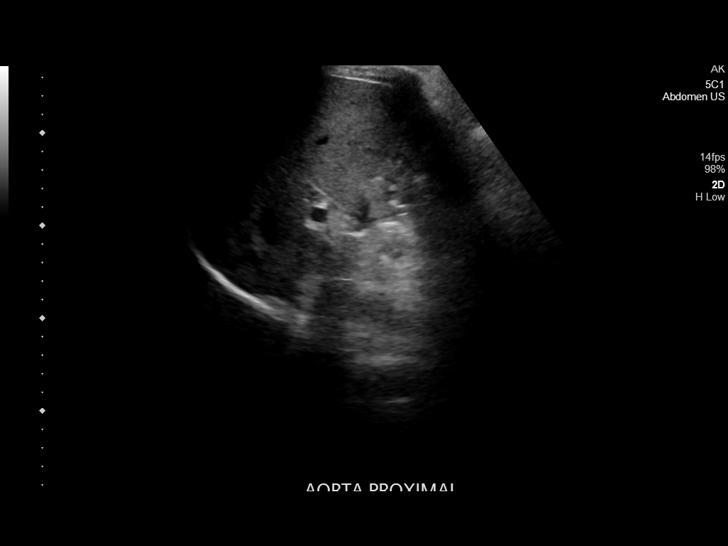
[im 3/32]
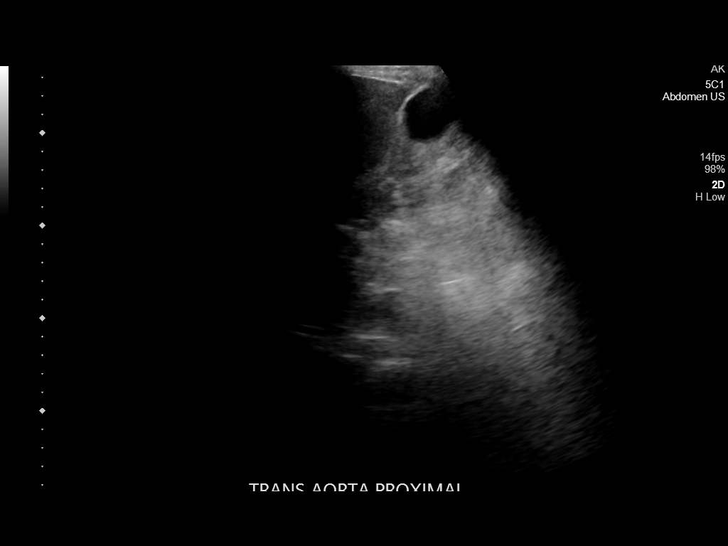
[im 6/32]
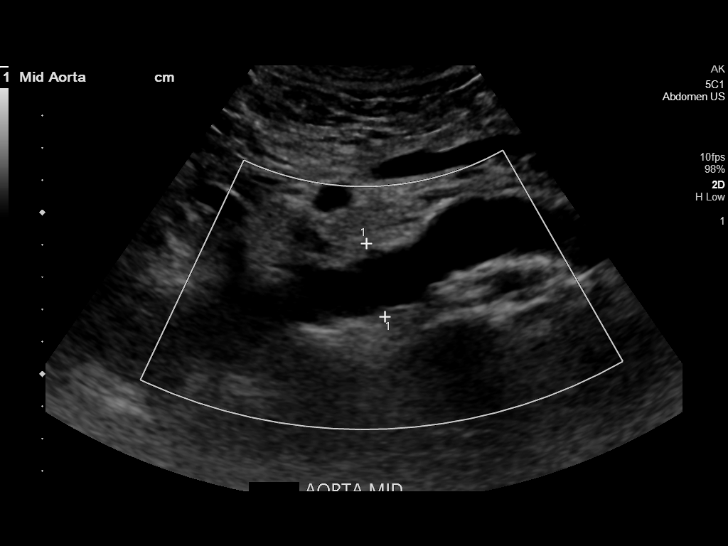
[im 8/32]
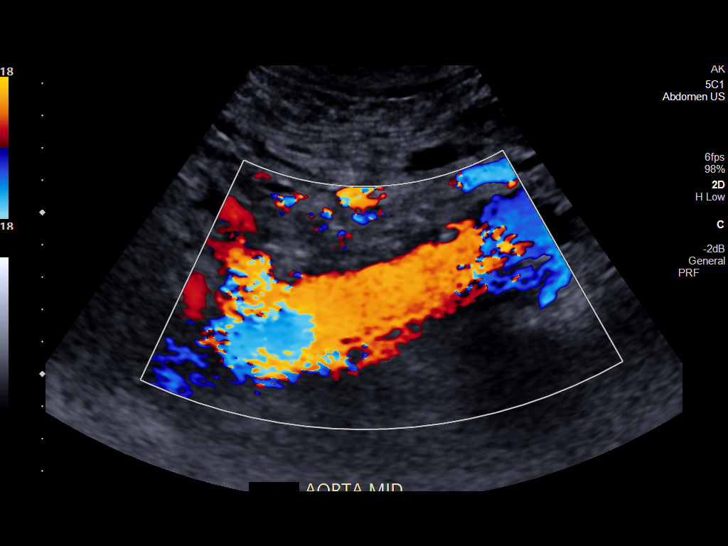
[im 11/32]
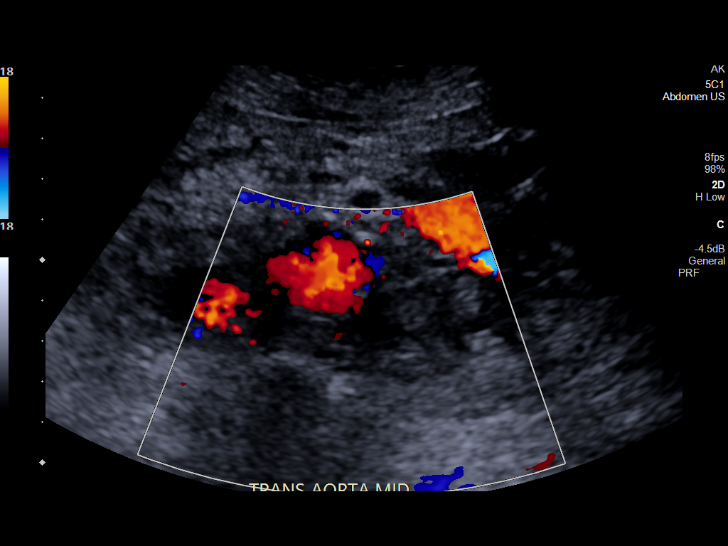
[im 12/32]
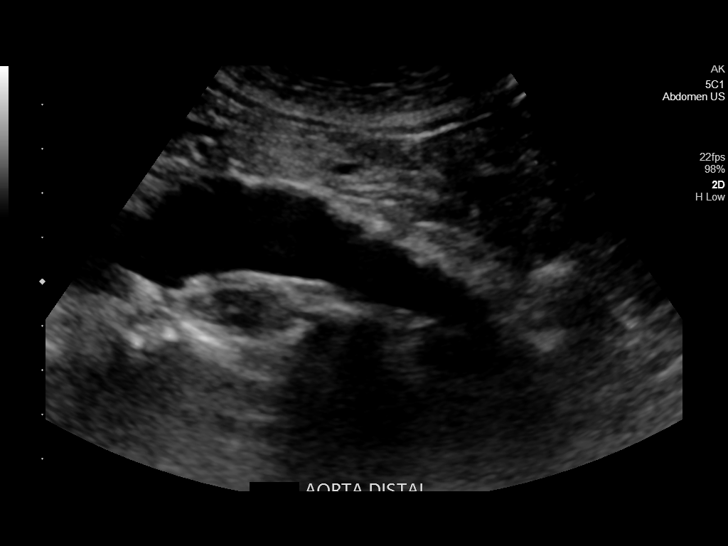
[im 15/32]
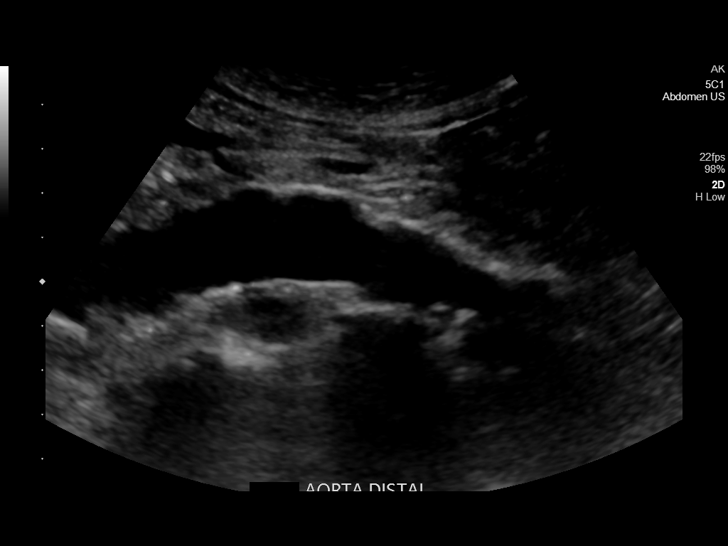
[im 17/32]
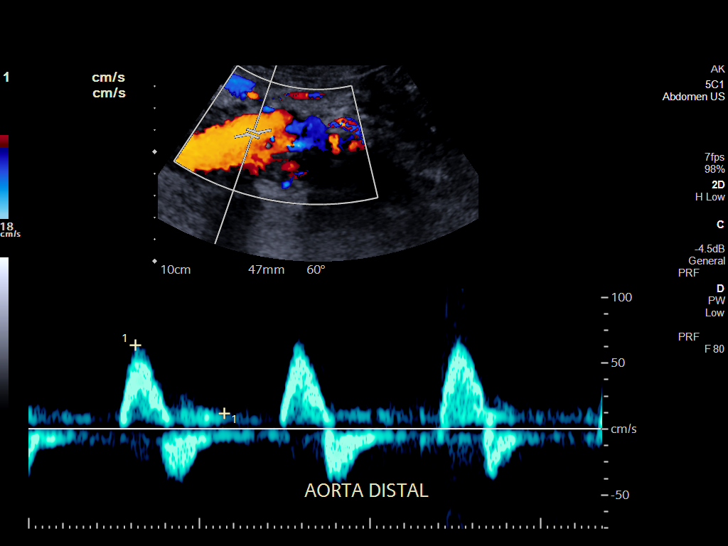
[im 20/32]
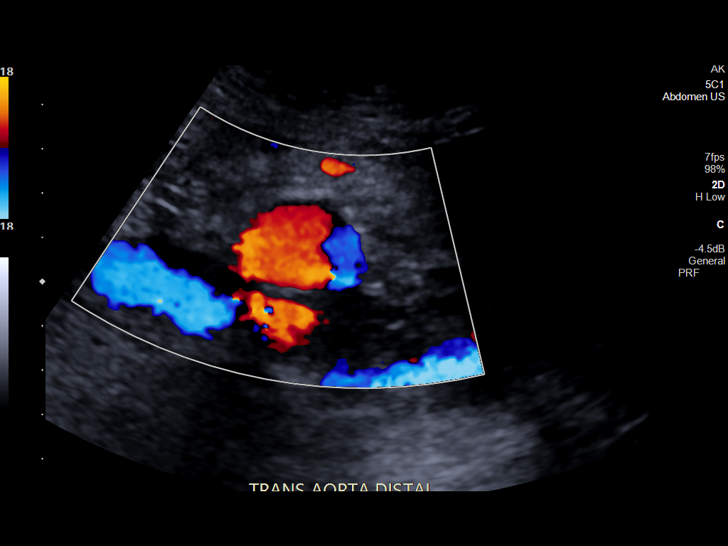
[im 21/32]
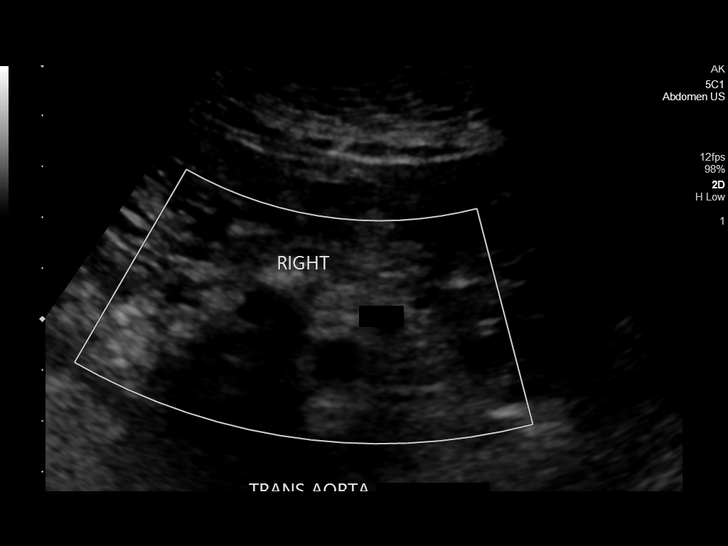
[im 24/32]
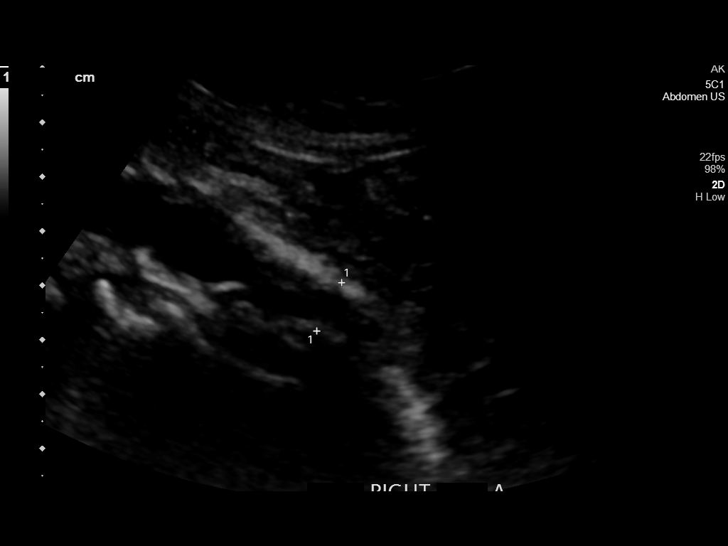
[im 26/32]
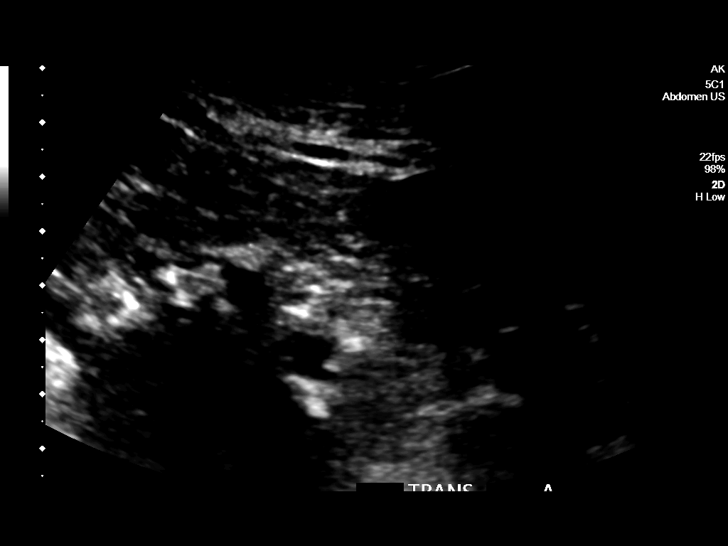
[im 29/32]
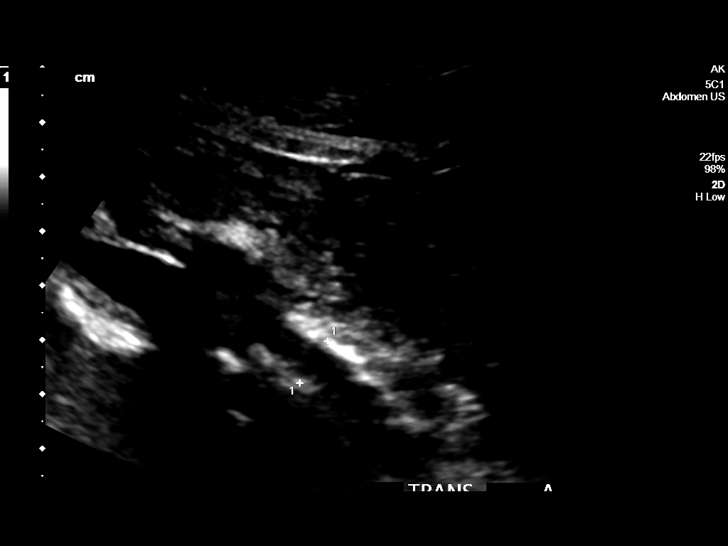
[im 32/32]
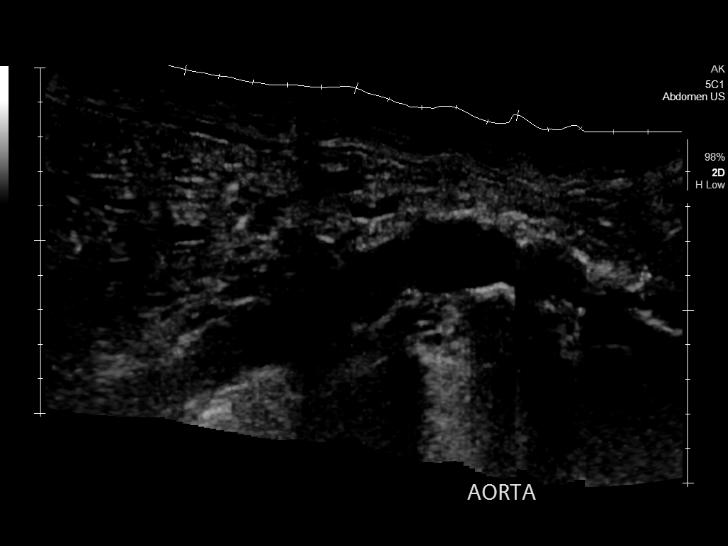

[14 of 25 positions shown; findings below may reference images not displayed]

FINDINGS: Abdominal aortic measurements as follows:

Proximal:  3.1 x 2.8 cm

Mid:  2.5 x 2.3 cm

Distal:  2.6 x 2.4 cm
Patent: Yes, peak systolic velocity is 64 cm/s

Right common iliac artery: 1.4 x 1.0 cm

Left common iliac artery: 1.1 x 1.0 cm

Scattered mixed echogenic plaque is seen throughout the abdominal
aorta.
IMPRESSION: 1. Mild ectasia of the abdominal aorta measuring 2.6 cm in maximal
diameter, similar to abdominal CT performed [DATE]. Recommend
follow-up aortic ultrasound in 5 years. This recommendation follows
ACR consensus guidelines: White Paper of the ACR Incidental Findings
Committee II on Vascular Findings. [HOSPITAL] 0797;
2.  Aortic Atherosclerosis ([id number]-VWS.S).

## 2024-02-09 ENCOUNTER — Other Ambulatory Visit: Payer: Self-pay

## 2024-02-09 ENCOUNTER — Emergency Department
Admission: EM | Admit: 2024-02-09 | Discharge: 2024-02-09 | Disposition: A | Discharge status: Home / Self Care | Attending: Emergency Medicine | Admitting: Emergency Medicine

## 2024-02-09 DIAGNOSIS — Y92009 Unspecified place in unspecified non-institutional (private) residence as the place of occurrence of the external cause: Secondary | ICD-10-CM | POA: Insufficient documentation

## 2024-02-09 DIAGNOSIS — T310 Burns involving less than 10% of body surface: Secondary | ICD-10-CM | POA: Diagnosis not present

## 2024-02-09 DIAGNOSIS — X028XXA Other exposure to controlled fire in building or structure, initial encounter: Secondary | ICD-10-CM | POA: Insufficient documentation

## 2024-02-09 DIAGNOSIS — T23261A Burn of second degree of back of right hand, initial encounter: Secondary | ICD-10-CM | POA: Insufficient documentation

## 2024-02-09 DIAGNOSIS — T23001A Burn of unspecified degree of right hand, unspecified site, initial encounter: Secondary | ICD-10-CM

## 2024-02-09 LAB — COMPREHENSIVE METABOLIC PANEL WITH GFR
ALT: 21 U/L (ref 0–44)
AST: 19 U/L (ref 15–41)
Albumin: 4.1 g/dL (ref 3.5–5.0)
Alkaline Phosphatase: 60 U/L (ref 38–126)
Anion gap: 10 (ref 5–15)
BUN: 22 mg/dL (ref 8–23)
CO2: 26 mmol/L (ref 22–32)
Calcium: 9.1 mg/dL (ref 8.9–10.3)
Chloride: 104 mmol/L (ref 98–111)
Creatinine, Ser: 1.03 mg/dL (ref 0.61–1.24)
GFR, Estimated: >60 mL/min (ref >60)
Glucose, Bld: 111 mg/dL — ABNORMAL HIGH (ref 70–99)
Potassium: 4.2 mmol/L (ref 3.5–5.1)
Sodium: 140 mmol/L (ref 135–145)
Total Bilirubin: 0.9 mg/dL (ref 0.0–1.2)
Total Protein: 7.2 g/dL (ref 6.5–8.1)

## 2024-02-09 LAB — CBC WITH DIFFERENTIAL/PLATELET
Abs Immature Granulocytes: 0.05 K/uL (ref 0.00–0.07)
Basophils Absolute: 0.1 K/uL (ref 0.0–0.1)
Basophils Relative: 1 %
Eosinophils Absolute: 0.3 K/uL (ref 0.0–0.5)
Eosinophils Relative: 4 %
HCT: 43 % (ref 39.0–52.0)
Hemoglobin: 14.3 g/dL (ref 13.0–17.0)
Immature Granulocytes: 1 %
Lymphocytes Relative: 25 %
Lymphs Abs: 2.2 K/uL (ref 0.7–4.0)
MCH: 30 pg (ref 26.0–34.0)
MCHC: 33.3 g/dL (ref 30.0–36.0)
MCV: 90.3 fL (ref 80.0–100.0)
Monocytes Absolute: 0.7 K/uL (ref 0.1–1.0)
Monocytes Relative: 8 %
Neutro Abs: 5.5 K/uL (ref 1.7–7.7)
Neutrophils Relative %: 61 %
Platelets: 207 K/uL (ref 150–400)
RBC: 4.76 MIL/uL (ref 4.22–5.81)
RDW: 13.1 % (ref 11.5–15.5)
WBC: 8.8 K/uL (ref 4.0–10.5)
nRBC: 0 % (ref 0.0–0.2)

## 2024-02-09 LAB — CK: Total CK: 73 U/L (ref 49–397)

## 2024-02-09 LAB — TROPONIN I (HIGH SENSITIVITY): Troponin I (High Sensitivity): 3 ng/L (ref <18)

## 2024-02-09 MED ORDER — BACITRACIN ZINC 500 UNIT/GM EX OINT
TOPICAL_OINTMENT | Freq: Once | CUTANEOUS | Status: AC
  Administered 2024-02-09: 2 via TOPICAL
  Filled 2024-02-09: qty 1.8

## 2024-02-09 NOTE — ED Notes (Signed)
 This RN dressed pt's R hand with nonadherent dressing and gauze wrap.
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
## 2024-02-09 NOTE — ED Notes (Addendum)
 AMA form explained, pt verbalized understanding. Pt signed AMA electronically, this RN signed as witness. AMA form printed and put in form basket to be scanned into medical records.
